# Patient Record
Sex: Female | Born: 1955 | Race: White | Hispanic: No | Marital: Married | Smoking: Former smoker
Health system: Southern US, Community
[De-identification: ages and names within clinical notes are randomized; demographics above are authoritative.]

## PROBLEM LIST (undated history)

## (undated) DIAGNOSIS — K759 Inflammatory liver disease, unspecified: Secondary | ICD-10-CM

## (undated) HISTORY — PX: WISDOM TOOTH EXTRACTION: SHX21

---

## 1984-08-09 HISTORY — PX: BREAST CYST EXCISION: SHX579

## 2005-09-03 ENCOUNTER — Ambulatory Visit: Payer: Self-pay | Admitting: Gastroenterology

## 2005-11-16 ENCOUNTER — Ambulatory Visit (HOSPITAL_COMMUNITY): Admission: RE | Admit: 2005-11-16 | Discharge: 2005-11-16 | Payer: Self-pay | Admitting: Gastroenterology

## 2006-12-31 IMAGING — US US BIOPSY
1 series · 12 of 12 positions shown · non-contrast
Comparison: none

CLINICAL DATA: Hepatitis C.  Request is made for random core liver biopsy.
 ULTRASOUND-GUIDED RANDOM CORE LIVER BIOPSY:

[Series 1: unknown · 0.28mm/px · 12 of 12 slices shown]
[im 1/12]
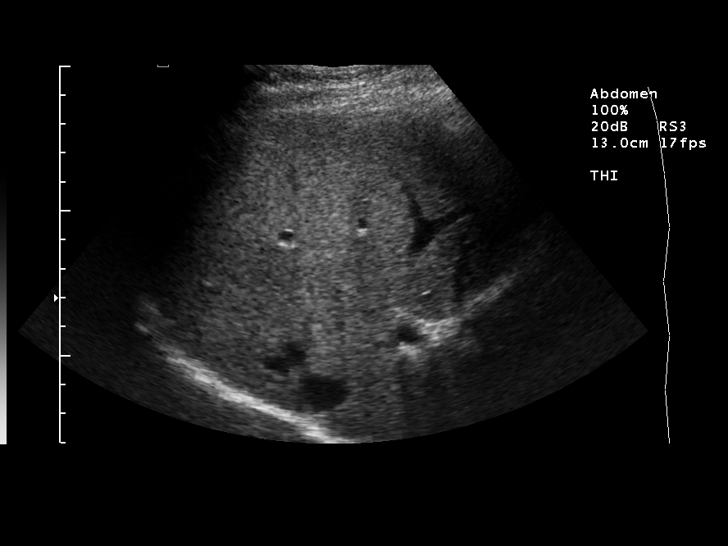
[im 2/12]
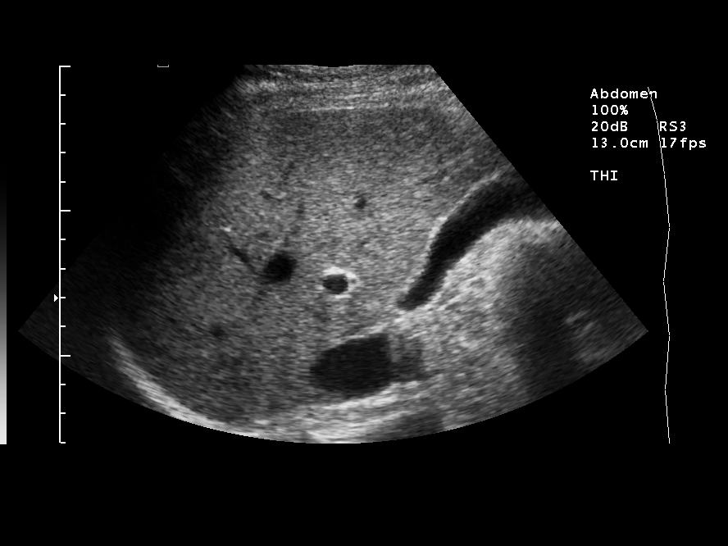
[im 3/12]
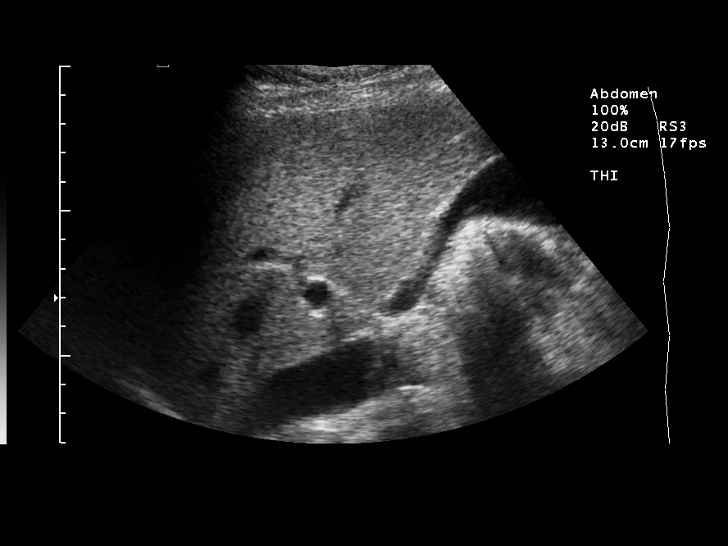
[im 4/12]
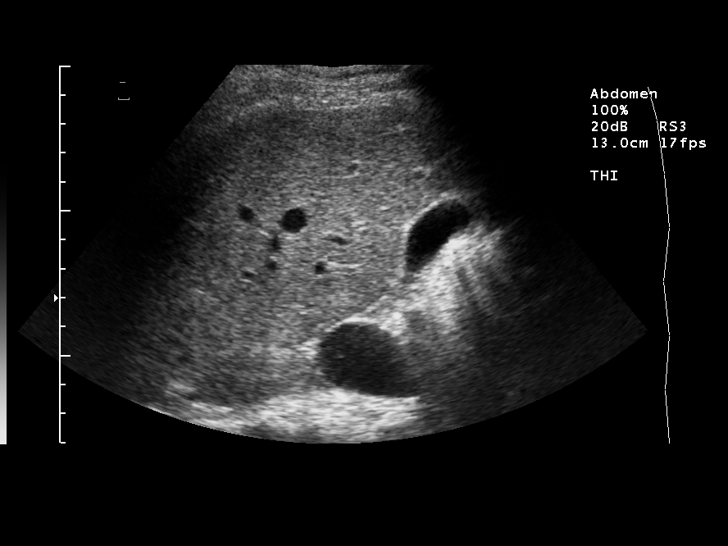
[im 5/12]
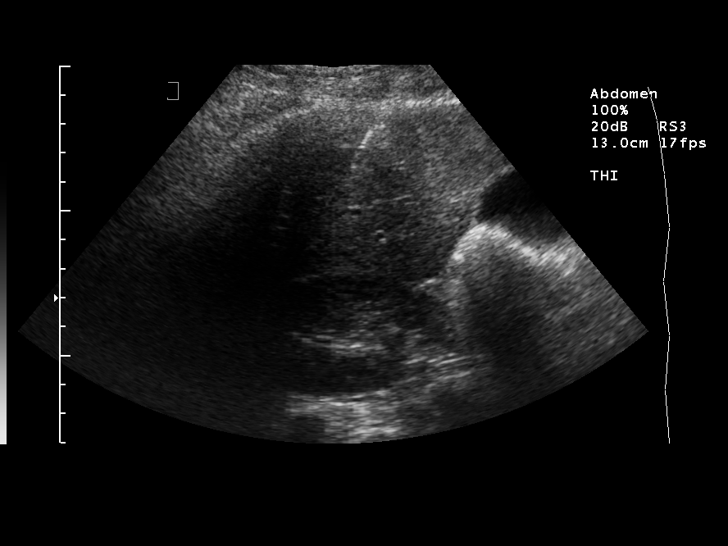
[im 6/12]
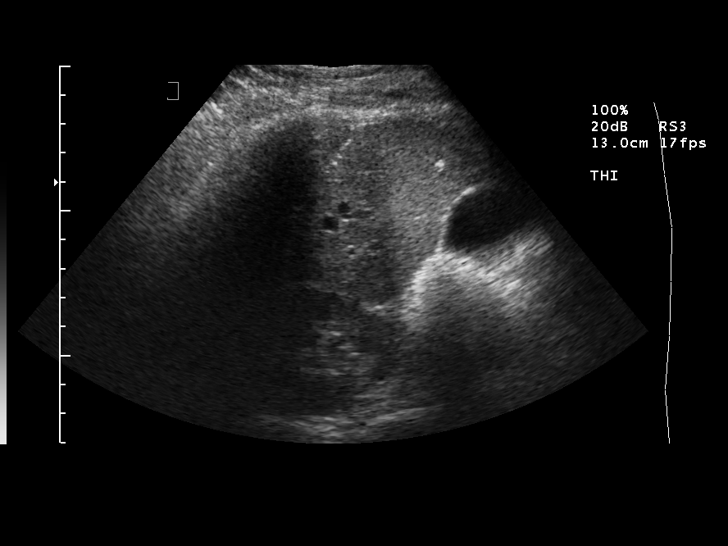
[im 7/12]
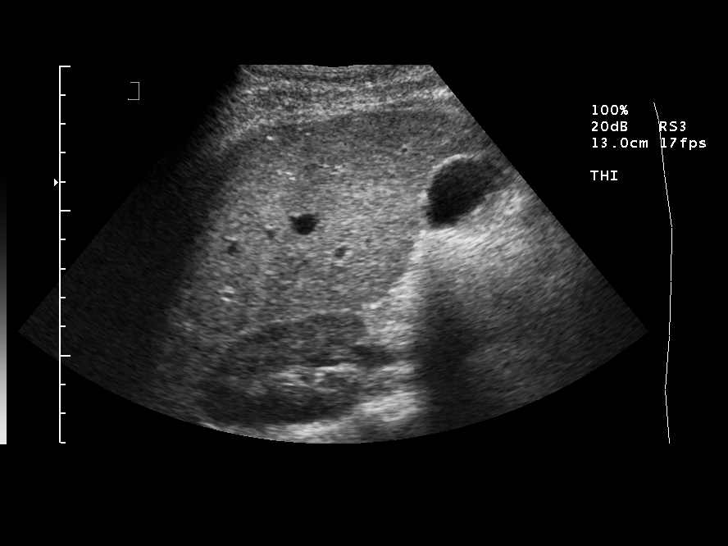
[im 8/12]
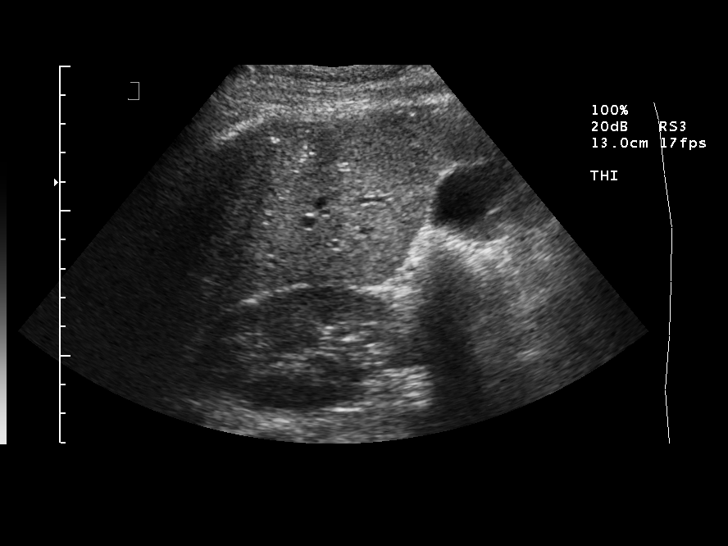
[im 9/12]
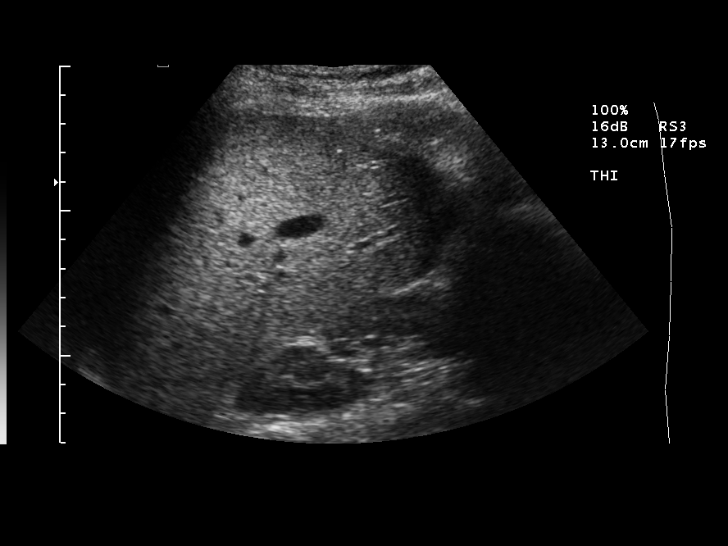
[im 10/12]
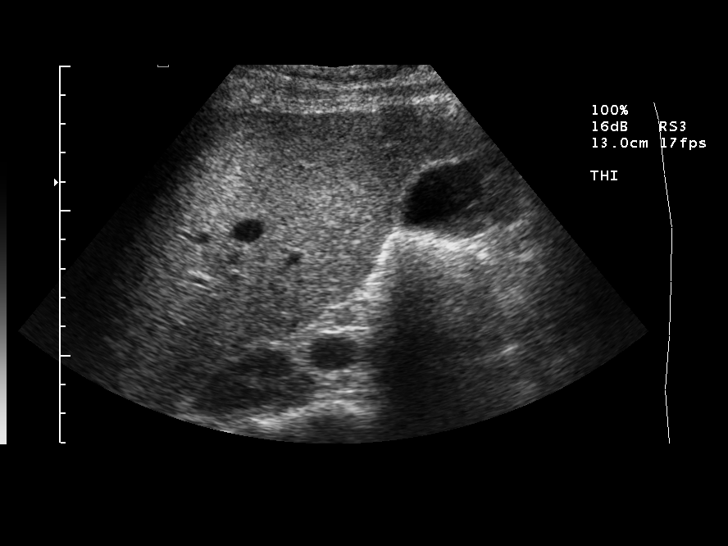
[im 11/12]
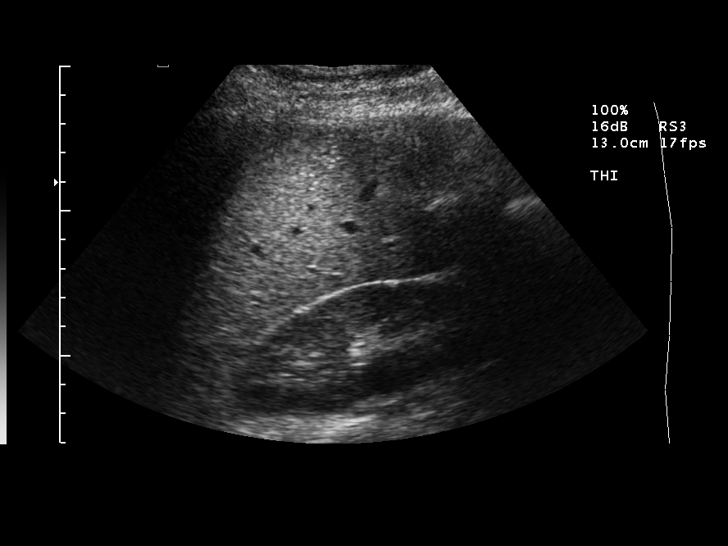
[im 12/12]
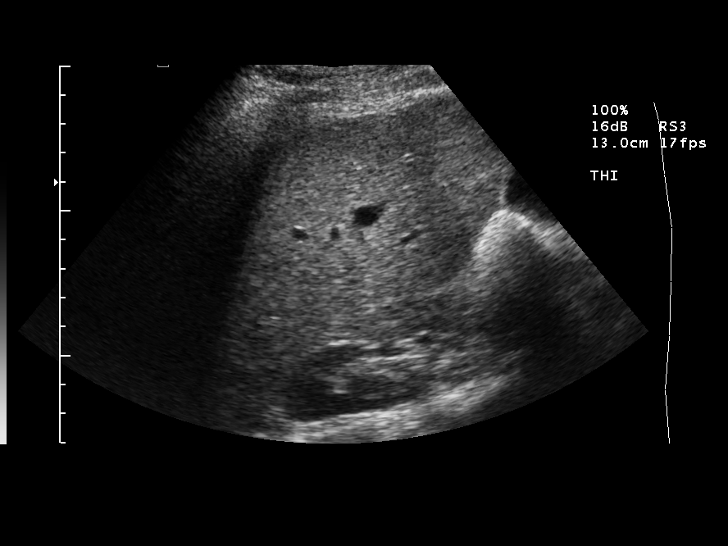

[12 of 12 positions shown; findings below may reference images not displayed]

FINDINGS: An ultrasound-guided random core liver biopsy was thoroughly discussed with the patient and questions were answered.  The benefits, risks, alternatives, and complications were also discussed.  The patient understands and wishes to proceed with the procedure.  Verbal as well as written consent was obtained.
 Under ultrasound guidance, an appropriate skin site was marked.  The patient was then prepped and draped in the normal sterile fashion.  1% lidocaine was used for local anesthesia.  Through a 17-gauge guiding trocar, three passes were then made into the right hepatic lobe with an 18-gauge biopsy gun.  Ultrasound imaging confirmed appropriate needle placement in the liver parenchyma.  Specimens were sent to pathology for further evaluation.  The patient tolerated the procedure well and there were no immediate complications.  
 Medications utilized:  Versed 2 mg IV, fentanyl 50 micrograms IV.  Cardiorespiratory monitoring was performed by the interventional radiology nurse during the procedure.  The patient?s vital signs remained stable throughout the procedure, and she will be observed in the [REDACTED] for an additional 3 hours post-biopsy and then discharged home afterwards if stable.
 Total Time of Sedation:  15 minutes.
 Procedure was performed under the supervision of Dr. Ebadat Tiger.
IMPRESSION: Successful ultrasound-guided random core liver biopsy of the right hepatic lobe as discussed above.

## 2015-04-12 DIAGNOSIS — H1010 Acute atopic conjunctivitis, unspecified eye: Secondary | ICD-10-CM | POA: Insufficient documentation

## 2015-04-12 DIAGNOSIS — J309 Allergic rhinitis, unspecified: Principal | ICD-10-CM

## 2015-04-12 DIAGNOSIS — G4720 Circadian rhythm sleep disorder, unspecified type: Secondary | ICD-10-CM

## 2015-05-12 ENCOUNTER — Ambulatory Visit (INDEPENDENT_AMBULATORY_CARE_PROVIDER_SITE_OTHER): Payer: BC Managed Care – PPO | Admitting: *Deleted

## 2015-05-12 DIAGNOSIS — J309 Allergic rhinitis, unspecified: Secondary | ICD-10-CM | POA: Diagnosis not present

## 2015-05-19 ENCOUNTER — Ambulatory Visit (INDEPENDENT_AMBULATORY_CARE_PROVIDER_SITE_OTHER): Payer: BC Managed Care – PPO | Admitting: *Deleted

## 2015-05-19 DIAGNOSIS — J309 Allergic rhinitis, unspecified: Secondary | ICD-10-CM | POA: Diagnosis not present

## 2015-05-26 ENCOUNTER — Ambulatory Visit (INDEPENDENT_AMBULATORY_CARE_PROVIDER_SITE_OTHER): Payer: BC Managed Care – PPO | Admitting: *Deleted

## 2015-05-26 DIAGNOSIS — J309 Allergic rhinitis, unspecified: Secondary | ICD-10-CM | POA: Diagnosis not present

## 2015-06-16 ENCOUNTER — Ambulatory Visit (INDEPENDENT_AMBULATORY_CARE_PROVIDER_SITE_OTHER): Payer: BC Managed Care – PPO

## 2015-06-16 DIAGNOSIS — J309 Allergic rhinitis, unspecified: Secondary | ICD-10-CM | POA: Diagnosis not present

## 2015-06-16 NOTE — Progress Notes (Signed)
This encounter was created in error - please disregard.

## 2015-07-07 ENCOUNTER — Ambulatory Visit (INDEPENDENT_AMBULATORY_CARE_PROVIDER_SITE_OTHER): Payer: BC Managed Care – PPO

## 2015-07-07 DIAGNOSIS — J309 Allergic rhinitis, unspecified: Secondary | ICD-10-CM | POA: Diagnosis not present

## 2015-07-16 DIAGNOSIS — J3089 Other allergic rhinitis: Secondary | ICD-10-CM | POA: Diagnosis not present

## 2015-07-21 ENCOUNTER — Ambulatory Visit (INDEPENDENT_AMBULATORY_CARE_PROVIDER_SITE_OTHER): Payer: BC Managed Care – PPO | Admitting: *Deleted

## 2015-07-21 DIAGNOSIS — J301 Allergic rhinitis due to pollen: Secondary | ICD-10-CM | POA: Diagnosis not present

## 2015-07-21 DIAGNOSIS — J309 Allergic rhinitis, unspecified: Secondary | ICD-10-CM

## 2015-07-28 ENCOUNTER — Ambulatory Visit (INDEPENDENT_AMBULATORY_CARE_PROVIDER_SITE_OTHER): Payer: BC Managed Care – PPO | Admitting: *Deleted

## 2015-07-28 DIAGNOSIS — J309 Allergic rhinitis, unspecified: Secondary | ICD-10-CM | POA: Diagnosis not present

## 2015-08-18 ENCOUNTER — Ambulatory Visit (INDEPENDENT_AMBULATORY_CARE_PROVIDER_SITE_OTHER): Payer: BC Managed Care – PPO

## 2015-08-18 DIAGNOSIS — J309 Allergic rhinitis, unspecified: Secondary | ICD-10-CM

## 2015-09-01 ENCOUNTER — Ambulatory Visit (INDEPENDENT_AMBULATORY_CARE_PROVIDER_SITE_OTHER): Payer: BC Managed Care – PPO | Admitting: *Deleted

## 2015-09-01 DIAGNOSIS — J309 Allergic rhinitis, unspecified: Secondary | ICD-10-CM | POA: Diagnosis not present

## 2015-09-15 ENCOUNTER — Ambulatory Visit (INDEPENDENT_AMBULATORY_CARE_PROVIDER_SITE_OTHER): Payer: BC Managed Care – PPO | Admitting: *Deleted

## 2015-09-15 DIAGNOSIS — J309 Allergic rhinitis, unspecified: Secondary | ICD-10-CM | POA: Diagnosis not present

## 2015-10-02 ENCOUNTER — Ambulatory Visit (INDEPENDENT_AMBULATORY_CARE_PROVIDER_SITE_OTHER): Payer: BC Managed Care – PPO | Admitting: *Deleted

## 2015-10-02 DIAGNOSIS — J309 Allergic rhinitis, unspecified: Secondary | ICD-10-CM | POA: Diagnosis not present

## 2015-10-06 ENCOUNTER — Ambulatory Visit (INDEPENDENT_AMBULATORY_CARE_PROVIDER_SITE_OTHER): Payer: BC Managed Care – PPO | Admitting: *Deleted

## 2015-10-06 DIAGNOSIS — J309 Allergic rhinitis, unspecified: Secondary | ICD-10-CM

## 2015-10-13 ENCOUNTER — Ambulatory Visit (INDEPENDENT_AMBULATORY_CARE_PROVIDER_SITE_OTHER): Payer: BC Managed Care – PPO

## 2015-10-13 DIAGNOSIS — J309 Allergic rhinitis, unspecified: Secondary | ICD-10-CM | POA: Diagnosis not present

## 2015-10-20 ENCOUNTER — Ambulatory Visit (INDEPENDENT_AMBULATORY_CARE_PROVIDER_SITE_OTHER): Payer: BC Managed Care – PPO | Admitting: *Deleted

## 2015-10-20 DIAGNOSIS — J309 Allergic rhinitis, unspecified: Secondary | ICD-10-CM | POA: Diagnosis not present

## 2015-10-30 ENCOUNTER — Ambulatory Visit (INDEPENDENT_AMBULATORY_CARE_PROVIDER_SITE_OTHER): Payer: BC Managed Care – PPO | Admitting: *Deleted

## 2015-10-30 DIAGNOSIS — J309 Allergic rhinitis, unspecified: Secondary | ICD-10-CM | POA: Diagnosis not present

## 2015-11-13 ENCOUNTER — Ambulatory Visit (INDEPENDENT_AMBULATORY_CARE_PROVIDER_SITE_OTHER): Payer: BC Managed Care – PPO | Admitting: *Deleted

## 2015-11-13 DIAGNOSIS — J309 Allergic rhinitis, unspecified: Secondary | ICD-10-CM | POA: Diagnosis not present

## 2015-11-24 ENCOUNTER — Ambulatory Visit (INDEPENDENT_AMBULATORY_CARE_PROVIDER_SITE_OTHER): Payer: BC Managed Care – PPO | Admitting: *Deleted

## 2015-11-24 DIAGNOSIS — J309 Allergic rhinitis, unspecified: Secondary | ICD-10-CM

## 2015-11-25 DIAGNOSIS — J301 Allergic rhinitis due to pollen: Secondary | ICD-10-CM | POA: Diagnosis not present

## 2015-11-26 DIAGNOSIS — J3089 Other allergic rhinitis: Secondary | ICD-10-CM | POA: Diagnosis not present

## 2015-12-04 ENCOUNTER — Ambulatory Visit (INDEPENDENT_AMBULATORY_CARE_PROVIDER_SITE_OTHER): Payer: BC Managed Care – PPO | Admitting: *Deleted

## 2015-12-04 DIAGNOSIS — J309 Allergic rhinitis, unspecified: Secondary | ICD-10-CM | POA: Diagnosis not present

## 2015-12-08 ENCOUNTER — Ambulatory Visit (INDEPENDENT_AMBULATORY_CARE_PROVIDER_SITE_OTHER): Payer: BC Managed Care – PPO

## 2015-12-08 DIAGNOSIS — J309 Allergic rhinitis, unspecified: Secondary | ICD-10-CM | POA: Diagnosis not present

## 2015-12-15 ENCOUNTER — Ambulatory Visit (INDEPENDENT_AMBULATORY_CARE_PROVIDER_SITE_OTHER): Payer: BC Managed Care – PPO

## 2015-12-15 DIAGNOSIS — J309 Allergic rhinitis, unspecified: Secondary | ICD-10-CM | POA: Diagnosis not present

## 2015-12-29 ENCOUNTER — Ambulatory Visit (INDEPENDENT_AMBULATORY_CARE_PROVIDER_SITE_OTHER): Payer: BC Managed Care – PPO

## 2015-12-29 DIAGNOSIS — J309 Allergic rhinitis, unspecified: Secondary | ICD-10-CM | POA: Diagnosis not present

## 2016-01-08 ENCOUNTER — Ambulatory Visit (INDEPENDENT_AMBULATORY_CARE_PROVIDER_SITE_OTHER): Payer: BC Managed Care – PPO

## 2016-01-08 DIAGNOSIS — J309 Allergic rhinitis, unspecified: Secondary | ICD-10-CM | POA: Diagnosis not present

## 2016-01-12 ENCOUNTER — Ambulatory Visit (INDEPENDENT_AMBULATORY_CARE_PROVIDER_SITE_OTHER): Payer: BC Managed Care – PPO | Admitting: *Deleted

## 2016-01-12 DIAGNOSIS — J309 Allergic rhinitis, unspecified: Secondary | ICD-10-CM | POA: Diagnosis not present

## 2016-01-19 ENCOUNTER — Ambulatory Visit (INDEPENDENT_AMBULATORY_CARE_PROVIDER_SITE_OTHER): Payer: BC Managed Care – PPO | Admitting: *Deleted

## 2016-01-19 DIAGNOSIS — J309 Allergic rhinitis, unspecified: Secondary | ICD-10-CM | POA: Diagnosis not present

## 2016-01-26 ENCOUNTER — Ambulatory Visit (INDEPENDENT_AMBULATORY_CARE_PROVIDER_SITE_OTHER): Payer: BC Managed Care – PPO

## 2016-01-26 DIAGNOSIS — J309 Allergic rhinitis, unspecified: Secondary | ICD-10-CM

## 2016-02-02 ENCOUNTER — Ambulatory Visit (INDEPENDENT_AMBULATORY_CARE_PROVIDER_SITE_OTHER): Payer: BC Managed Care – PPO | Admitting: *Deleted

## 2016-02-02 DIAGNOSIS — J309 Allergic rhinitis, unspecified: Secondary | ICD-10-CM | POA: Diagnosis not present

## 2016-02-16 ENCOUNTER — Ambulatory Visit (INDEPENDENT_AMBULATORY_CARE_PROVIDER_SITE_OTHER): Payer: BC Managed Care – PPO

## 2016-02-16 DIAGNOSIS — J309 Allergic rhinitis, unspecified: Secondary | ICD-10-CM

## 2016-02-17 DIAGNOSIS — J301 Allergic rhinitis due to pollen: Secondary | ICD-10-CM | POA: Diagnosis not present

## 2016-02-18 DIAGNOSIS — J3089 Other allergic rhinitis: Secondary | ICD-10-CM | POA: Diagnosis not present

## 2016-02-23 ENCOUNTER — Ambulatory Visit (INDEPENDENT_AMBULATORY_CARE_PROVIDER_SITE_OTHER): Payer: BC Managed Care – PPO | Admitting: *Deleted

## 2016-02-23 DIAGNOSIS — J309 Allergic rhinitis, unspecified: Secondary | ICD-10-CM | POA: Diagnosis not present

## 2016-03-01 ENCOUNTER — Ambulatory Visit (INDEPENDENT_AMBULATORY_CARE_PROVIDER_SITE_OTHER): Payer: BC Managed Care – PPO

## 2016-03-01 DIAGNOSIS — J309 Allergic rhinitis, unspecified: Secondary | ICD-10-CM

## 2016-03-08 ENCOUNTER — Ambulatory Visit (INDEPENDENT_AMBULATORY_CARE_PROVIDER_SITE_OTHER): Payer: BC Managed Care – PPO | Admitting: *Deleted

## 2016-03-08 DIAGNOSIS — J309 Allergic rhinitis, unspecified: Secondary | ICD-10-CM | POA: Diagnosis not present

## 2016-03-22 ENCOUNTER — Ambulatory Visit (INDEPENDENT_AMBULATORY_CARE_PROVIDER_SITE_OTHER): Payer: BC Managed Care – PPO | Admitting: *Deleted

## 2016-03-22 DIAGNOSIS — J309 Allergic rhinitis, unspecified: Secondary | ICD-10-CM | POA: Diagnosis not present

## 2016-03-29 ENCOUNTER — Ambulatory Visit (INDEPENDENT_AMBULATORY_CARE_PROVIDER_SITE_OTHER): Payer: BC Managed Care – PPO | Admitting: *Deleted

## 2016-03-29 DIAGNOSIS — J309 Allergic rhinitis, unspecified: Secondary | ICD-10-CM

## 2016-04-05 ENCOUNTER — Ambulatory Visit (INDEPENDENT_AMBULATORY_CARE_PROVIDER_SITE_OTHER): Payer: BC Managed Care – PPO | Admitting: *Deleted

## 2016-04-05 DIAGNOSIS — J309 Allergic rhinitis, unspecified: Secondary | ICD-10-CM

## 2016-04-19 ENCOUNTER — Ambulatory Visit (INDEPENDENT_AMBULATORY_CARE_PROVIDER_SITE_OTHER): Payer: BC Managed Care – PPO | Admitting: *Deleted

## 2016-04-19 DIAGNOSIS — J309 Allergic rhinitis, unspecified: Secondary | ICD-10-CM

## 2016-04-26 ENCOUNTER — Ambulatory Visit (INDEPENDENT_AMBULATORY_CARE_PROVIDER_SITE_OTHER): Payer: BC Managed Care – PPO | Admitting: *Deleted

## 2016-04-26 DIAGNOSIS — J309 Allergic rhinitis, unspecified: Secondary | ICD-10-CM | POA: Diagnosis not present

## 2016-05-14 ENCOUNTER — Ambulatory Visit (INDEPENDENT_AMBULATORY_CARE_PROVIDER_SITE_OTHER): Payer: BC Managed Care – PPO | Admitting: *Deleted

## 2016-05-14 DIAGNOSIS — J309 Allergic rhinitis, unspecified: Secondary | ICD-10-CM | POA: Diagnosis not present

## 2016-05-20 ENCOUNTER — Ambulatory Visit: Payer: BC Managed Care – PPO | Admitting: Allergy and Immunology

## 2016-05-28 ENCOUNTER — Ambulatory Visit (INDEPENDENT_AMBULATORY_CARE_PROVIDER_SITE_OTHER): Payer: BC Managed Care – PPO | Admitting: *Deleted

## 2016-05-28 DIAGNOSIS — J309 Allergic rhinitis, unspecified: Secondary | ICD-10-CM

## 2016-06-14 ENCOUNTER — Ambulatory Visit (INDEPENDENT_AMBULATORY_CARE_PROVIDER_SITE_OTHER): Payer: BC Managed Care – PPO | Admitting: *Deleted

## 2016-06-14 DIAGNOSIS — J309 Allergic rhinitis, unspecified: Secondary | ICD-10-CM | POA: Diagnosis not present

## 2016-06-16 DIAGNOSIS — J3089 Other allergic rhinitis: Secondary | ICD-10-CM | POA: Diagnosis not present

## 2016-06-17 DIAGNOSIS — J301 Allergic rhinitis due to pollen: Secondary | ICD-10-CM | POA: Diagnosis not present

## 2016-06-24 ENCOUNTER — Ambulatory Visit (INDEPENDENT_AMBULATORY_CARE_PROVIDER_SITE_OTHER): Payer: BC Managed Care – PPO | Admitting: *Deleted

## 2016-06-24 DIAGNOSIS — J309 Allergic rhinitis, unspecified: Secondary | ICD-10-CM

## 2016-07-08 ENCOUNTER — Ambulatory Visit (INDEPENDENT_AMBULATORY_CARE_PROVIDER_SITE_OTHER): Payer: BC Managed Care – PPO | Admitting: *Deleted

## 2016-07-08 DIAGNOSIS — J309 Allergic rhinitis, unspecified: Secondary | ICD-10-CM

## 2016-07-19 ENCOUNTER — Ambulatory Visit (INDEPENDENT_AMBULATORY_CARE_PROVIDER_SITE_OTHER): Payer: BC Managed Care – PPO | Admitting: *Deleted

## 2016-07-19 DIAGNOSIS — J309 Allergic rhinitis, unspecified: Secondary | ICD-10-CM

## 2016-08-05 ENCOUNTER — Ambulatory Visit (INDEPENDENT_AMBULATORY_CARE_PROVIDER_SITE_OTHER): Payer: BC Managed Care – PPO | Admitting: *Deleted

## 2016-08-05 DIAGNOSIS — J309 Allergic rhinitis, unspecified: Secondary | ICD-10-CM | POA: Diagnosis not present

## 2016-08-16 ENCOUNTER — Ambulatory Visit (INDEPENDENT_AMBULATORY_CARE_PROVIDER_SITE_OTHER): Payer: BC Managed Care – PPO | Admitting: *Deleted

## 2016-08-16 DIAGNOSIS — J309 Allergic rhinitis, unspecified: Secondary | ICD-10-CM | POA: Diagnosis not present

## 2016-08-31 NOTE — Addendum Note (Signed)
Addended by: Berna BueWHITAKER, Armie Moren L on: 08/31/2016 02:01 PM   Modules accepted: Orders

## 2016-09-02 ENCOUNTER — Ambulatory Visit (INDEPENDENT_AMBULATORY_CARE_PROVIDER_SITE_OTHER): Payer: BC Managed Care – PPO | Admitting: *Deleted

## 2016-09-02 DIAGNOSIS — J309 Allergic rhinitis, unspecified: Secondary | ICD-10-CM | POA: Diagnosis not present

## 2016-09-13 ENCOUNTER — Ambulatory Visit (INDEPENDENT_AMBULATORY_CARE_PROVIDER_SITE_OTHER): Payer: BC Managed Care – PPO | Admitting: *Deleted

## 2016-09-13 DIAGNOSIS — J309 Allergic rhinitis, unspecified: Secondary | ICD-10-CM

## 2016-09-27 ENCOUNTER — Ambulatory Visit (INDEPENDENT_AMBULATORY_CARE_PROVIDER_SITE_OTHER): Payer: BC Managed Care – PPO | Admitting: *Deleted

## 2016-09-27 DIAGNOSIS — J309 Allergic rhinitis, unspecified: Secondary | ICD-10-CM | POA: Diagnosis not present

## 2016-10-18 ENCOUNTER — Ambulatory Visit (INDEPENDENT_AMBULATORY_CARE_PROVIDER_SITE_OTHER): Payer: BC Managed Care – PPO | Admitting: *Deleted

## 2016-10-18 DIAGNOSIS — J309 Allergic rhinitis, unspecified: Secondary | ICD-10-CM

## 2016-10-28 DIAGNOSIS — J301 Allergic rhinitis due to pollen: Secondary | ICD-10-CM | POA: Diagnosis not present

## 2016-10-29 DIAGNOSIS — J3089 Other allergic rhinitis: Secondary | ICD-10-CM | POA: Diagnosis not present

## 2016-11-08 ENCOUNTER — Ambulatory Visit (INDEPENDENT_AMBULATORY_CARE_PROVIDER_SITE_OTHER): Payer: BC Managed Care – PPO | Admitting: *Deleted

## 2016-11-08 DIAGNOSIS — J309 Allergic rhinitis, unspecified: Secondary | ICD-10-CM

## 2016-11-29 ENCOUNTER — Ambulatory Visit (INDEPENDENT_AMBULATORY_CARE_PROVIDER_SITE_OTHER): Payer: BC Managed Care – PPO | Admitting: *Deleted

## 2016-11-29 DIAGNOSIS — J309 Allergic rhinitis, unspecified: Secondary | ICD-10-CM | POA: Diagnosis not present

## 2016-12-16 ENCOUNTER — Ambulatory Visit (INDEPENDENT_AMBULATORY_CARE_PROVIDER_SITE_OTHER): Payer: BC Managed Care – PPO | Admitting: *Deleted

## 2016-12-16 DIAGNOSIS — J309 Allergic rhinitis, unspecified: Secondary | ICD-10-CM

## 2017-01-10 ENCOUNTER — Ambulatory Visit (INDEPENDENT_AMBULATORY_CARE_PROVIDER_SITE_OTHER): Payer: BC Managed Care – PPO | Admitting: *Deleted

## 2017-01-10 DIAGNOSIS — J309 Allergic rhinitis, unspecified: Secondary | ICD-10-CM | POA: Diagnosis not present

## 2017-01-31 ENCOUNTER — Ambulatory Visit (INDEPENDENT_AMBULATORY_CARE_PROVIDER_SITE_OTHER): Payer: BC Managed Care – PPO

## 2017-01-31 DIAGNOSIS — J309 Allergic rhinitis, unspecified: Secondary | ICD-10-CM | POA: Diagnosis not present

## 2017-02-17 ENCOUNTER — Ambulatory Visit (INDEPENDENT_AMBULATORY_CARE_PROVIDER_SITE_OTHER): Payer: BC Managed Care – PPO

## 2017-02-17 DIAGNOSIS — J309 Allergic rhinitis, unspecified: Secondary | ICD-10-CM | POA: Diagnosis not present

## 2017-03-07 ENCOUNTER — Ambulatory Visit (INDEPENDENT_AMBULATORY_CARE_PROVIDER_SITE_OTHER): Payer: BC Managed Care – PPO | Admitting: *Deleted

## 2017-03-07 DIAGNOSIS — J309 Allergic rhinitis, unspecified: Secondary | ICD-10-CM | POA: Diagnosis not present

## 2017-03-21 ENCOUNTER — Ambulatory Visit (INDEPENDENT_AMBULATORY_CARE_PROVIDER_SITE_OTHER): Payer: BC Managed Care – PPO | Admitting: *Deleted

## 2017-03-21 DIAGNOSIS — J309 Allergic rhinitis, unspecified: Secondary | ICD-10-CM | POA: Diagnosis not present

## 2017-04-08 ENCOUNTER — Ambulatory Visit (INDEPENDENT_AMBULATORY_CARE_PROVIDER_SITE_OTHER): Payer: BC Managed Care – PPO | Admitting: *Deleted

## 2017-04-08 DIAGNOSIS — J309 Allergic rhinitis, unspecified: Secondary | ICD-10-CM

## 2017-04-25 ENCOUNTER — Ambulatory Visit (INDEPENDENT_AMBULATORY_CARE_PROVIDER_SITE_OTHER): Payer: BC Managed Care – PPO | Admitting: *Deleted

## 2017-04-25 DIAGNOSIS — J309 Allergic rhinitis, unspecified: Secondary | ICD-10-CM

## 2017-04-28 ENCOUNTER — Encounter: Payer: Self-pay | Admitting: *Deleted

## 2017-04-28 NOTE — Progress Notes (Signed)
This encounter was created in error - please disregard.

## 2017-05-16 ENCOUNTER — Ambulatory Visit (INDEPENDENT_AMBULATORY_CARE_PROVIDER_SITE_OTHER): Payer: BC Managed Care – PPO | Admitting: *Deleted

## 2017-05-16 DIAGNOSIS — J309 Allergic rhinitis, unspecified: Secondary | ICD-10-CM

## 2017-05-25 NOTE — Progress Notes (Signed)
VIALS EXP 05-25-18 

## 2017-05-26 DIAGNOSIS — J3089 Other allergic rhinitis: Secondary | ICD-10-CM | POA: Diagnosis not present

## 2017-05-27 ENCOUNTER — Ambulatory Visit (INDEPENDENT_AMBULATORY_CARE_PROVIDER_SITE_OTHER): Payer: BC Managed Care – PPO | Admitting: *Deleted

## 2017-05-27 DIAGNOSIS — J309 Allergic rhinitis, unspecified: Secondary | ICD-10-CM | POA: Diagnosis not present

## 2017-06-06 ENCOUNTER — Ambulatory Visit (INDEPENDENT_AMBULATORY_CARE_PROVIDER_SITE_OTHER): Payer: BC Managed Care – PPO | Admitting: *Deleted

## 2017-06-06 DIAGNOSIS — J309 Allergic rhinitis, unspecified: Secondary | ICD-10-CM

## 2017-07-04 ENCOUNTER — Ambulatory Visit (INDEPENDENT_AMBULATORY_CARE_PROVIDER_SITE_OTHER): Payer: BC Managed Care – PPO | Admitting: *Deleted

## 2017-07-04 DIAGNOSIS — J309 Allergic rhinitis, unspecified: Secondary | ICD-10-CM

## 2017-08-08 ENCOUNTER — Ambulatory Visit (INDEPENDENT_AMBULATORY_CARE_PROVIDER_SITE_OTHER): Payer: BC Managed Care – PPO | Admitting: *Deleted

## 2017-08-08 DIAGNOSIS — J309 Allergic rhinitis, unspecified: Secondary | ICD-10-CM

## 2022-01-18 ENCOUNTER — Ambulatory Visit: Payer: Self-pay | Admitting: Orthopedic Surgery

## 2022-01-21 NOTE — Patient Instructions (Signed)
DUE TO COVID-19 ONLY TWO VISITORS  (aged 64 and older)  ARE ALLOWED TO COME WITH YOU AND STAY IN THE WAITING ROOM ONLY DURING PRE OP AND PROCEDURE.   **NO VISITORS ARE ALLOWED IN THE SHORT STAY AREA OR RECOVERY ROOM!!**  IF YOU WILL BE ADMITTED INTO THE HOSPITAL YOU ARE ALLOWED ONLY FOUR SUPPORT PEOPLE DURING VISITATION HOURS ONLY (7 AM -8PM)   The support person(s) must pass our screening, gel in and out, and wear a mask at all times, including in the patient's room. Patients must also wear a mask when staff or their support person are in the room. Visitors GUEST BADGE MUST BE WORN VISIBLY  One adult visitor may remain with you overnight and MUST be in the room by 8 P.M.     Your procedure is scheduled on: 01/27/22   Report to [facility] Main Entrance    Report to admitting at   5:45 AM   Call this number if you have problems the morning of surgery [phone]   Do not eat food :After Midnight.   After Midnight you may have the following liquids until _5:30_ AM/  DAY OF SURGERY  Water Black Coffee (sugar ok, NO MILK/CREAM OR CREAMERS)  Tea (sugar ok, NO MILK/CREAM OR CREAMERS) regular and decaf                             Plain Jell-O (NO RED)                                           Fruit ices (not with fruit pulp, NO RED)                                     Popsicles (NO RED)                                                                  Juice: apple, WHITE grape, WHITE cranberry Sports drinks like Gatorade (NO RED) Clear broth(vegetable,chicken,beef)                   The day of surgery:  Drink ONE (1) Pre-Surgery Clear Ensure at 5:15 AM the morning of surgery. Drink in one sitting. Do not sip.  This drink was given to you during your hospital  pre-op appointment visit. Nothing else to drink after completing the  Pre-Surgery Clear Ensure at 5:30 AM          If you have questions, please contact your surgeon's office.   FOLLOW BOWEL PREP AND ANY ADDITIONAL  PRE OP INSTRUCTIONS YOU RECEIVED FROM YOUR SURGEON'S OFFICE!!!     Oral Hygiene is also important to reduce your risk of infection.                                    Remember - BRUSH YOUR TEETH THE MORNING OF SURGERY WITH YOUR REGULAR TOOTHPASTE   Do NOT smoke after Midnight  Take these medicines the morning of surgery with A SIP OF WATER: Zyrtec, use eye drops as usual   Bring CPAP mask and tubing day of surgery.                              You may not have any metal on your body including hair pins, jewelry, and body piercing             Do not wear make-up, lotions, powders, perfumes/cologne, or deodorant  Do not wear nail polish including gel and S&S, artificial/acrylic nails, or any other type of covering on natural nails including finger and toenails. If you have artificial nails, gel coating, etc. that needs to be removed by a nail salon please have this removed prior to surgery or surgery may need to be canceled/ delayed if the surgeon/ anesthesia feels like they are unable to be safely monitored.   Do not shave  48 hours prior to surgery.     Do not bring valuables to the hospital. [facility] IS NOT             RESPONSIBLE   FOR VALUABLES.   Contacts, dentures or bridgework may not be worn into surgery.   Bring small overnight bag day of surgery.   DO NOT BRING YOUR HOME MEDICATIONS TO THE HOSPITAL. PHARMACY WILL DISPENSE MEDICATIONS LISTED ON YOUR MEDICATION LIST TO YOU DURING YOUR ADMISSION IN THE HOSPITAL!       Special Instructions: Bring a copy of your healthcare power of attorney and living will documents the day of surgery if you haven't scanned them before.              Please read over the following fact sheets you were given: IF YOU HAVE QUESTIONS ABOUT YOUR PRE-OP INSTRUCTIONS PLEASE CALL [phone]     [facility] - Preparing for Surgery Before surgery, you can play an important role.  Because skin is not sterile, your skin needs to be as free of  germs as possible.  You can reduce the number of germs on your skin by washing with CHG (chlorahexidine gluconate) soap before surgery.  CHG is an antiseptic cleaner which kills germs and bonds with the skin to continue killing germs even after washing. Please DO NOT use if you have an allergy to CHG or antibacterial soaps.  If your skin becomes reddened/irritated stop using the CHG and inform your nurse when you arrive at Short Stay. Do not shave (including legs and underarms) for at least 48 hours prior to the first CHG shower.   Please follow these instructions carefully:  1.  Shower with CHG Soap the night before surgery and the  morning of Surgery.  2.  If you choose to wash your hair, wash your hair first as usual with your  normal  shampoo.  3.  After you shampoo, rinse your hair and body thoroughly to remove the  shampoo.                            4.  Use CHG as you would any other liquid soap.  You can apply chg directly  to the skin and wash                       Gently with a scrungie or clean washcloth.  5.  Apply the CHG Soap to  your body ONLY FROM THE NECK DOWN.   Do not use on face/ open                           Wound or open sores. Avoid contact with eyes, ears mouth and genitals (private parts).                       Wash face,  Genitals (private parts) with your normal soap.             6.  Wash thoroughly, paying special attention to the area where your surgery  will be performed.  7.  Thoroughly rinse your body with warm water from the neck down.  8.  DO NOT shower/wash with your normal soap after using and rinsing off  the CHG Soap.                9.  Pat yourself dry with a clean towel.            10.  Wear clean pajamas.            11.  Place clean sheets on your bed the night of your first shower and do not  sleep with pets. Day of Surgery : Do not apply any lotions/deodorants the morning of surgery.  Please wear clean clothes to the hospital/surgery center.  FAILURE TO  FOLLOW THESE INSTRUCTIONS MAY RESULT IN THE CANCELLATION OF YOUR SURGERY    ________________________________________________________________________   Incentive Spirometer  An incentive spirometer is a tool that can help keep your lungs clear and active. This tool measures how well you are filling your lungs with each breath. Taking long deep breaths may help reverse or decrease the chance of developing breathing (pulmonary) problems (especially infection) following: A long period of time when you are unable to move or be active. BEFORE THE PROCEDURE  If the spirometer includes an indicator to show your best effort, your nurse or respiratory therapist will set it to a desired goal. If possible, sit up straight or lean slightly forward. Try not to slouch. Hold the incentive spirometer in an upright position. INSTRUCTIONS FOR USE  Sit on the edge of your bed if possible, or sit up as far as you can in bed or on a chair. Hold the incentive spirometer in an upright position. Breathe out normally. Place the mouthpiece in your mouth and seal your lips tightly around it. Breathe in slowly and as deeply as possible, raising the piston or the ball toward the top of the column. Hold your breath for 3-5 seconds or for as long as possible. Allow the piston or ball to fall to the bottom of the column. Remove the mouthpiece from your mouth and breathe out normally. Rest for a few seconds and repeat Steps 1 through 7 at least 10 times every 1-2 hours when you are awake. Take your time and take a few normal breaths between deep breaths. The spirometer may include an indicator to show your best effort. Use the indicator as a goal to work toward during each repetition. After each set of 10 deep breaths, practice coughing to be sure your lungs are clear. If you have an incision (the cut made at the time of surgery), support your incision when coughing by placing a pillow or rolled up towels firmly against  it. Once you are able to get out of bed, walk around indoors and cough well.  You may stop using the incentive spirometer when instructed by your caregiver.  RISKS AND COMPLICATIONS Take your time so you do not get dizzy or light-headed. If you are in pain, you may need to take or ask for pain medication before doing incentive spirometry. It is harder to take a deep breath if you are having pain. AFTER USE Rest and breathe slowly and easily. It can be helpful to keep track of a log of your progress. Your caregiver can provide you with a simple table to help with this. If you are using the spirometer at home, follow these instructions: SEEK MEDICAL CARE IF:  You are having difficultly using the spirometer. You have trouble using the spirometer as often as instructed. Your pain medication is not giving enough relief while using the spirometer. You develop fever of 100.5 F (38.1 C) or higher. SEEK IMMEDIATE MEDICAL CARE IF:  You cough up bloody sputum that had not been present before. You develop fever of 102 F (38.9 C) or greater. You develop worsening pain at or near the incision site. MAKE SURE YOU:  Understand these instructions. Will watch your condition. Will get help right away if you are not doing well or get worse. Document Released: 12/06/2006 Document Revised: 10/18/2011 Document Reviewed: 02/06/2007 [facility] Patient Information 2014 [place], [place].   ________________________________________________________________________

## 2022-01-22 ENCOUNTER — Encounter (HOSPITAL_COMMUNITY)
Admission: RE | Admit: 2022-01-22 | Discharge: 2022-01-22 | Disposition: A | Discharge status: Home / Self Care | Payer: BC Managed Care – PPO | Source: Ambulatory Visit | Attending: Specialist | Admitting: Specialist

## 2022-01-22 ENCOUNTER — Other Ambulatory Visit: Payer: Self-pay

## 2022-01-22 ENCOUNTER — Encounter (HOSPITAL_COMMUNITY): Payer: Self-pay | Admitting: *Deleted

## 2022-01-22 DIAGNOSIS — Z01812 Encounter for preprocedural laboratory examination: Secondary | ICD-10-CM | POA: Diagnosis present

## 2022-01-22 DIAGNOSIS — Z01818 Encounter for other preprocedural examination: Secondary | ICD-10-CM

## 2022-01-22 HISTORY — DX: Inflammatory liver disease, unspecified: K75.9

## 2022-01-22 LAB — BASIC METABOLIC PANEL
Anion gap: 6 (ref 5–15)
BUN: 13 mg/dL (ref 8–23)
CO2: 27 mmol/L (ref 22–32)
Calcium: 9.5 mg/dL (ref 8.9–10.3)
Chloride: 107 mmol/L (ref 98–111)
Creatinine, Ser: 0.56 mg/dL (ref 0.44–1.00)
GFR, Estimated: >60 mL/min (ref >60)
Glucose, Bld: 104 mg/dL — ABNORMAL HIGH (ref 70–99)
Potassium: 4.3 mmol/L (ref 3.5–5.1)
Sodium: 140 mmol/L (ref 135–145)

## 2022-01-22 LAB — CBC
HCT: 40.6 % (ref 36.0–46.0)
Hemoglobin: 14 g/dL (ref 12.0–15.0)
MCH: 32.3 pg (ref 26.0–34.0)
MCHC: 34.5 g/dL (ref 30.0–36.0)
MCV: 93.5 fL (ref 80.0–100.0)
Platelets: 277 10*3/uL (ref 150–400)
RBC: 4.34 MIL/uL (ref 3.87–5.11)
RDW: 12.9 % (ref 11.5–15.5)
WBC: 5.3 10*3/uL (ref 4.0–10.5)
nRBC: 0 % (ref 0.0–0.2)

## 2022-01-22 LAB — SURGICAL PCR SCREEN
MRSA, PCR: NEGATIVE
Staphylococcus aureus: NEGATIVE

## 2022-01-22 NOTE — Progress Notes (Signed)
Anesthesia note:  Bowel prep reminder:NA  PCP - Dr. Jones Broom Cardiologist -none Other-   Chest x-ray - no EKG - 01/12/22-epic Stress Test - no ECHO - no Cardiac Cath - NA  Pacemaker/ICD device last checked:NA  Sleep Study - no CPAP -   Pt is pre diabetic-NA Fasting Blood Sugar -  Checks Blood Sugar _____  Blood Thinner:NA Blood Thinner Instructions: Aspirin Instructions: Last Dose:  Anesthesia review: No  Patient denies shortness of breath, fever, cough and chest pain at PAT appointment Pt has no SOB with any activities  Patient verbalized understanding of instructions that were given to them at the PAT appointment. Patient was also instructed that they will need to review over the PAT instructions again at home before surgery. Yes husband was with her at PAT visit

## 2022-01-25 ENCOUNTER — Ambulatory Visit: Payer: Self-pay | Admitting: Orthopedic Surgery

## 2022-01-25 NOTE — H&P (Signed)
Cindy Walter is an 66 y.o. female.   Chief Complaint: Right knee pain HPI: Patient is here for her H&P. Patient is scheduled for a right total knee replacement by Dr. Shelle Iron on 01/27/22 at [facility].  Patient with ongoing right knee pain and instability refractory to conservative treatment including injection therapy, bracing, quad strengthening, activity modifications, relative rest, medications. This is interfering with quality of life and activities of daily living at this point and she desires to proceed with surgery. She has been cleared by her PCP.  Dr. Shelle Iron and the patient mutually agreed to proceed with a total knee replacement. Risks and benefits of the procedure were discussed including stiffness, suboptimal range of motion, persistent pain, infection requiring removal of prosthesis and reinsertion, need for prophylactic antibiotics in the future, for example, dental procedures, possible need for manipulation, revision in the future and also anesthetic complications including DVT, PE, etc. We discussed the perioperative course, time in the hospital, postoperative recovery and the need for elevation to control swelling. We also discussed the predicted range of motion and the probability that squatting and kneeling would be unobtainable in the future. In addition, postoperative anticoagulation was discussed. We have obtained preoperative medical clearance as necessary. Provided illustrated handout and discussed it in detail. They will enroll in the total joint replacement educational forum at the hospital.  Past Medical History:  Diagnosis Date   Hepatitis    treated 2017    Past Surgical History:  Procedure Laterality Date   BREAST CYST EXCISION Right 1986   CESAREAN SECTION     x 3   WISDOM TOOTH EXTRACTION     age 44    No family history on file. Social History:  reports that she quit smoking about 14 years ago. Her smoking use included cigarettes. She has a 20.00  pack-year smoking history. She has never used smokeless tobacco. She reports current alcohol use. She reports that she does not currently use drugs.  Allergies: No Known Allergies  Current meds: alendronate 70 mg tablet cetirizine 10 mg tablet cholecalciferol (vitamin D3) 25 mcg (1,000 unit) capsule traMADoL 50 mg tablet  Review of Systems  Constitutional: Negative.   HENT: Negative.    Eyes: Negative.   Respiratory: Negative.    Cardiovascular: Negative.   Gastrointestinal: Negative.   Endocrine: Negative.   Genitourinary: Negative.   Musculoskeletal:  Positive for arthralgias, gait problem, joint swelling and myalgias.  Skin: Negative.   Psychiatric/Behavioral: Negative.      There were no vitals taken for this visit. Physical Exam Constitutional:      Appearance: Normal appearance.  HENT:     Head: Normocephalic and atraumatic.     Right Ear: External ear normal.     Left Ear: External ear normal.     Nose: Nose normal.     Mouth/Throat:     Pharynx: Oropharynx is clear.  Eyes:     Conjunctiva/sclera: Conjunctivae normal.  Cardiovascular:     Rate and Rhythm: Normal rate and regular rhythm.     Pulses: Normal pulses.     Heart sounds: Normal heart sounds.  Pulmonary:     Effort: Pulmonary effort is normal.     Breath sounds: Normal breath sounds.  Abdominal:     General: Bowel sounds are normal.  Musculoskeletal:     Cervical back: Normal range of motion.     Comments: Right knee she is tender medial joint line patellofemoral pain compression. Slight varus deformity. No DVT. Ipsilateral  hip and ankle exam is unremarkable. Mild effusion  Neurological:     Mental Status: She is alert.      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 Shelle Iron on June 21. We again discussed  the procedure itself as well as risks, complications and alternatives, including but not limited to DVT, PE, infx, bleeding, failure of procedure, need for secondary procedure including manipulation, nerve injury, ongoing pain/symptoms, anesthesia risk, even stroke or death. Also discussed typical post-op protocols, activity restrictions, need for PT, flexion/extension exercises, time out of work. Discussed need for DVT ppx post-op per protocol. Discussed dental ppx and infx prevention. Also discussed limitations post-operatively such as kneeling and squatting. All questions were answered. Patient desires to proceed with surgery as scheduled.  Will hold supplements, ASA and NSAIDs accordingly. Will remain NPO after midnight the night before surgery. Will present to [facility] for pre-op testing. Anticipate hospital stay to include at least 2 midnights given medical history and to ensure proper pain control. Plan ASA 81mg  BID for DVT ppx post-op. Plan oxycodone, Robaxin, Colace, Miralax. Plan home with HHPT post-op with family members at home for assistance. Will follow up 10-14 days post-op for suture removal and xrays.  Plan Right total knee replacement  , PA-C for Dr Dorothy Spark 01/25/2022, 12:35 PM

## 2022-01-25 NOTE — H&P (View-Only) (Signed)
Cindy Walter is an 66 y.o. female.   Chief Complaint: Right knee pain HPI: Patient is here for her H&P. Patient is scheduled for a right total knee replacement by Dr. Beane on 01/27/22 at [facility].  Patient with ongoing right knee pain and instability refractory to conservative treatment including injection therapy, bracing, quad strengthening, activity modifications, relative rest, medications. This is interfering with quality of life and activities of daily living at this point and she desires to proceed with surgery. She has been cleared by her PCP.  Dr. Beane and the patient mutually agreed to proceed with a total knee replacement. Risks and benefits of the procedure were discussed including stiffness, suboptimal range of motion, persistent pain, infection requiring removal of prosthesis and reinsertion, need for prophylactic antibiotics in the future, for example, dental procedures, possible need for manipulation, revision in the future and also anesthetic complications including DVT, PE, etc. We discussed the perioperative course, time in the hospital, postoperative recovery and the need for elevation to control swelling. We also discussed the predicted range of motion and the probability that squatting and kneeling would be unobtainable in the future. In addition, postoperative anticoagulation was discussed. We have obtained preoperative medical clearance as necessary. Provided illustrated handout and discussed it in detail. They will enroll in the total joint replacement educational forum at the hospital.  Past Medical History:  Diagnosis Date   Hepatitis    treated 2017    Past Surgical History:  Procedure Laterality Date   BREAST CYST EXCISION Right 1986   CESAREAN SECTION     x 3   WISDOM TOOTH EXTRACTION     age 52    No family history on file. Social History:  reports that she quit smoking about 14 years ago. Her smoking use included cigarettes. She has a 20.00  pack-year smoking history. She has never used smokeless tobacco. She reports current alcohol use. She reports that she does not currently use drugs.  Allergies: No Known Allergies  Current meds: alendronate 70 mg tablet cetirizine 10 mg tablet cholecalciferol (vitamin D3) 25 mcg (1,000 unit) capsule traMADoL 50 mg tablet  Review of Systems  Constitutional: Negative.   HENT: Negative.    Eyes: Negative.   Respiratory: Negative.    Cardiovascular: Negative.   Gastrointestinal: Negative.   Endocrine: Negative.   Genitourinary: Negative.   Musculoskeletal:  Positive for arthralgias, gait problem, joint swelling and myalgias.  Skin: Negative.   Psychiatric/Behavioral: Negative.      There were no vitals taken for this visit. Physical Exam Constitutional:      Appearance: Normal appearance.  HENT:     Head: Normocephalic and atraumatic.     Right Ear: External ear normal.     Left Ear: External ear normal.     Nose: Nose normal.     Mouth/Throat:     Pharynx: Oropharynx is clear.  Eyes:     Conjunctiva/sclera: Conjunctivae normal.  Cardiovascular:     Rate and Rhythm: Normal rate and regular rhythm.     Pulses: Normal pulses.     Heart sounds: Normal heart sounds.  Pulmonary:     Effort: Pulmonary effort is normal.     Breath sounds: Normal breath sounds.  Abdominal:     General: Bowel sounds are normal.  Musculoskeletal:     Cervical back: Normal range of motion.     Comments: Right knee she is tender medial joint line patellofemoral pain compression. Slight varus deformity. No DVT. Ipsilateral   hip and ankle exam is unremarkable. Mild effusion  Neurological:     Mental Status: She is alert.      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 Shelle Iron on June 21. We again discussed  the procedure itself as well as risks, complications and alternatives, including but not limited to DVT, PE, infx, bleeding, failure of procedure, need for secondary procedure including manipulation, nerve injury, ongoing pain/symptoms, anesthesia risk, even stroke or death. Also discussed typical post-op protocols, activity restrictions, need for PT, flexion/extension exercises, time out of work. Discussed need for DVT ppx post-op per protocol. Discussed dental ppx and infx prevention. Also discussed limitations post-operatively such as kneeling and squatting. All questions were answered. Patient desires to proceed with surgery as scheduled.  Will hold supplements, ASA and NSAIDs accordingly. Will remain NPO after midnight the night before surgery. Will present to [facility] for pre-op testing. Anticipate hospital stay to include at least 2 midnights given medical history and to ensure proper pain control. Plan ASA 81mg  BID for DVT ppx post-op. Plan oxycodone, Robaxin, Colace, Miralax. Plan home with HHPT post-op with family members at home for assistance. Will follow up 10-14 days post-op for suture removal and xrays.  Plan Right total knee replacement  , PA-C for Dr Dorothy Spark 01/25/2022, 12:35 PM

## 2022-01-27 ENCOUNTER — Ambulatory Visit (HOSPITAL_COMMUNITY): Payer: BC Managed Care – PPO

## 2022-01-27 ENCOUNTER — Other Ambulatory Visit: Payer: Self-pay

## 2022-01-27 ENCOUNTER — Encounter (HOSPITAL_COMMUNITY): Payer: Self-pay | Admitting: Specialist

## 2022-01-27 ENCOUNTER — Ambulatory Visit (HOSPITAL_COMMUNITY): Payer: BC Managed Care – PPO | Admitting: Certified Registered Nurse Anesthetist

## 2022-01-27 ENCOUNTER — Ambulatory Visit (HOSPITAL_COMMUNITY)
Admission: RE | Admit: 2022-01-27 | Discharge: 2022-01-28 | Disposition: A | Discharge status: Home / Self Care | Payer: BC Managed Care – PPO | Source: Ambulatory Visit | Attending: Specialist | Admitting: Specialist

## 2022-01-27 ENCOUNTER — Encounter (HOSPITAL_COMMUNITY)
Admission: RE | Disposition: A | Discharge status: Home / Self Care | Payer: Self-pay | Source: Ambulatory Visit | Attending: Specialist

## 2022-01-27 DIAGNOSIS — Z87891 Personal history of nicotine dependence: Secondary | ICD-10-CM | POA: Insufficient documentation

## 2022-01-27 DIAGNOSIS — M1711 Unilateral primary osteoarthritis, right knee: Secondary | ICD-10-CM | POA: Diagnosis present

## 2022-01-27 DIAGNOSIS — M21161 Varus deformity, not elsewhere classified, right knee: Secondary | ICD-10-CM | POA: Diagnosis not present

## 2022-01-27 DIAGNOSIS — Z01818 Encounter for other preprocedural examination: Secondary | ICD-10-CM

## 2022-01-27 SURGERY — ARTHROPLASTY, KNEE, TOTAL
Anesthesia: Spinal | Site: Knee | Laterality: Right

## 2022-01-27 MED ORDER — HYDROMORPHONE HCL 1 MG/ML IJ SOLN: 0.2500 mg | INTRAMUSCULAR | Status: DC | PRN

## 2022-01-27 MED ORDER — CHLORHEXIDINE GLUCONATE 0.12 % MT SOLN
15.0000 mL | Freq: Once | OROMUCOSAL | Status: AC
  Administered 2022-01-27: 15 mL via OROMUCOSAL

## 2022-01-27 MED ORDER — ROPIVACAINE HCL 5 MG/ML IJ SOLN
INTRAMUSCULAR | Status: DC | PRN
  Administered 2022-01-27: 30 mL via PERINEURAL

## 2022-01-27 MED ORDER — PROPOFOL 1000 MG/100ML IV EMUL
INTRAVENOUS | Status: AC
  Filled 2022-01-27: qty 100

## 2022-01-27 MED ORDER — FENTANYL CITRATE (PF) 100 MCG/2ML IJ SOLN
INTRAMUSCULAR | Status: DC | PRN
  Administered 2022-01-27: 25 ug via INTRAVENOUS
  Administered 2022-01-27: 50 ug via INTRAVENOUS
  Administered 2022-01-27: 25 ug via INTRAVENOUS

## 2022-01-27 MED ORDER — DOCUSATE SODIUM 100 MG PO CAPS
100.0000 mg | ORAL_CAPSULE | Freq: Two times a day (BID) | ORAL | Status: DC
  Administered 2022-01-27 – 2022-01-28 (×3): 100 mg via ORAL
  Filled 2022-01-27 (×3): qty 1

## 2022-01-27 MED ORDER — ONDANSETRON HCL 4 MG/2ML IJ SOLN
4.0000 mg | Freq: Four times a day (QID) | INTRAMUSCULAR | Status: DC | PRN
  Administered 2022-01-28: 4 mg via INTRAVENOUS
  Filled 2022-01-27: qty 2

## 2022-01-27 MED ORDER — LORATADINE 10 MG PO TABS
10.0000 mg | ORAL_TABLET | Freq: Every day | ORAL | Status: DC
  Administered 2022-01-28: 10 mg via ORAL
  Filled 2022-01-27: qty 1

## 2022-01-27 MED ORDER — METHOCARBAMOL 500 MG IVPB - SIMPLE MED: 500.0000 mg | Freq: Four times a day (QID) | INTRAVENOUS | Status: DC | PRN

## 2022-01-27 MED ORDER — KCL IN DEXTROSE-NACL 20-5-0.45 MEQ/L-%-% IV SOLN
INTRAVENOUS | Status: AC
  Filled 2022-01-27 (×2): qty 1000

## 2022-01-27 MED ORDER — POLYETHYLENE GLYCOL 3350 17 G PO PACK
17.0000 g | PACK | Freq: Every day | ORAL | Status: DC
  Administered 2022-01-27 – 2022-01-28 (×2): 17 g via ORAL
  Filled 2022-01-27 (×2): qty 1

## 2022-01-27 MED ORDER — ASPIRIN 81 MG PO CHEW
81.0000 mg | CHEWABLE_TABLET | Freq: Two times a day (BID) | ORAL | Status: DC
  Administered 2022-01-28: 81 mg via ORAL
  Filled 2022-01-27: qty 1

## 2022-01-27 MED ORDER — PHENYLEPHRINE 80 MCG/ML (10ML) SYRINGE FOR IV PUSH (FOR BLOOD PRESSURE SUPPORT)
PREFILLED_SYRINGE | INTRAVENOUS | Status: AC
  Filled 2022-01-27: qty 10

## 2022-01-27 MED ORDER — PHENYLEPHRINE 80 MCG/ML (10ML) SYRINGE FOR IV PUSH (FOR BLOOD PRESSURE SUPPORT)
PREFILLED_SYRINGE | INTRAVENOUS | Status: DC | PRN
  Administered 2022-01-27 (×3): 40 ug via INTRAVENOUS
  Administered 2022-01-27: 80 ug via INTRAVENOUS

## 2022-01-27 MED ORDER — TRANEXAMIC ACID-NACL 1000-0.7 MG/100ML-% IV SOLN
1000.0000 mg | INTRAVENOUS | Status: AC
  Administered 2022-01-27: 1000 mg via INTRAVENOUS
  Filled 2022-01-27: qty 100

## 2022-01-27 MED ORDER — ACETAMINOPHEN 325 MG PO TABS: 325.0000 mg | ORAL_TABLET | Freq: Once | ORAL | Status: DC | PRN

## 2022-01-27 MED ORDER — EPHEDRINE 5 MG/ML INJ
INTRAVENOUS | Status: AC
  Filled 2022-01-27: qty 5

## 2022-01-27 MED ORDER — LACTATED RINGERS IV SOLN: INTRAVENOUS | Status: DC

## 2022-01-27 MED ORDER — POLYETHYLENE GLYCOL 3350 17 G PO PACK: 17.0000 g | PACK | Freq: Every day | ORAL | 0 refills | Status: AC

## 2022-01-27 MED ORDER — SODIUM CHLORIDE 0.9 % IR SOLN
Status: DC | PRN
  Administered 2022-01-27: 1000 mL

## 2022-01-27 MED ORDER — BUPIVACAINE LIPOSOME 1.3 % IJ SUSP
INTRAMUSCULAR | Status: AC
  Filled 2022-01-27: qty 20

## 2022-01-27 MED ORDER — ACETAMINOPHEN 160 MG/5ML PO SOLN: 325.0000 mg | Freq: Once | ORAL | Status: DC | PRN

## 2022-01-27 MED ORDER — METOCLOPRAMIDE HCL 5 MG/ML IJ SOLN: 5.0000 mg | Freq: Three times a day (TID) | INTRAMUSCULAR | Status: DC | PRN

## 2022-01-27 MED ORDER — BISACODYL 5 MG PO TBEC: 5.0000 mg | DELAYED_RELEASE_TABLET | Freq: Every day | ORAL | Status: DC | PRN

## 2022-01-27 MED ORDER — ONDANSETRON HCL 4 MG/2ML IJ SOLN
INTRAMUSCULAR | Status: AC
  Filled 2022-01-27: qty 2

## 2022-01-27 MED ORDER — PROPOFOL 500 MG/50ML IV EMUL
INTRAVENOUS | Status: DC | PRN
  Administered 2022-01-27: 75 ug/kg/min via INTRAVENOUS

## 2022-01-27 MED ORDER — HYDROMORPHONE HCL 1 MG/ML IJ SOLN
0.5000 mg | INTRAMUSCULAR | Status: DC | PRN
  Administered 2022-01-27 – 2022-01-28 (×3): 1 mg via INTRAVENOUS
  Filled 2022-01-27 (×3): qty 1

## 2022-01-27 MED ORDER — STERILE WATER FOR IRRIGATION IR SOLN
Status: DC | PRN
  Administered 2022-01-27: 2000 mL

## 2022-01-27 MED ORDER — AMISULPRIDE (ANTIEMETIC) 5 MG/2ML IV SOLN: 10.0000 mg | Freq: Once | INTRAVENOUS | Status: DC | PRN

## 2022-01-27 MED ORDER — BUPIVACAINE-EPINEPHRINE (PF) 0.25% -1:200000 IJ SOLN
INTRAMUSCULAR | Status: AC
  Filled 2022-01-27: qty 30

## 2022-01-27 MED ORDER — ASCORBIC ACID 500 MG PO TABS
1000.0000 mg | ORAL_TABLET | Freq: Every day | ORAL | Status: DC
  Administered 2022-01-27 – 2022-01-28 (×2): 1000 mg via ORAL
  Filled 2022-01-27 (×2): qty 2

## 2022-01-27 MED ORDER — MEPERIDINE HCL 50 MG/ML IJ SOLN: 6.2500 mg | INTRAMUSCULAR | Status: DC | PRN

## 2022-01-27 MED ORDER — MENTHOL 3 MG MT LOZG: 1.0000 | LOZENGE | OROMUCOSAL | Status: DC | PRN

## 2022-01-27 MED ORDER — ACETAMINOPHEN 325 MG PO TABS: 325.0000 mg | ORAL_TABLET | Freq: Four times a day (QID) | ORAL | Status: DC | PRN

## 2022-01-27 MED ORDER — BUPIVACAINE-EPINEPHRINE 0.25% -1:200000 IJ SOLN
INTRAMUSCULAR | Status: DC | PRN
  Administered 2022-01-27 (×2): 30 mL

## 2022-01-27 MED ORDER — DIPHENHYDRAMINE HCL 12.5 MG/5ML PO ELIX: 12.5000 mg | ORAL_SOLUTION | ORAL | Status: DC | PRN

## 2022-01-27 MED ORDER — VITAMIN D 25 MCG (1000 UNIT) PO TABS
1000.0000 [IU] | ORAL_TABLET | Freq: Every day | ORAL | Status: DC
  Administered 2022-01-27 – 2022-01-28 (×2): 1000 [IU] via ORAL
  Filled 2022-01-27 (×2): qty 1

## 2022-01-27 MED ORDER — DOCUSATE SODIUM 100 MG PO CAPS
100.0000 mg | ORAL_CAPSULE | Freq: Two times a day (BID) | ORAL | 1 refills | Status: AC | PRN
Start: 2022-01-27 — End: ?

## 2022-01-27 MED ORDER — OXYCODONE HCL 5 MG PO TABS: 5.0000 mg | ORAL_TABLET | ORAL | 0 refills | Status: AC | PRN

## 2022-01-27 MED ORDER — EPHEDRINE SULFATE-NACL 50-0.9 MG/10ML-% IV SOSY
PREFILLED_SYRINGE | INTRAVENOUS | Status: DC | PRN
  Administered 2022-01-27: 5 mg via INTRAVENOUS

## 2022-01-27 MED ORDER — DEXAMETHASONE SODIUM PHOSPHATE 10 MG/ML IJ SOLN
INTRAMUSCULAR | Status: AC
  Filled 2022-01-27: qty 1

## 2022-01-27 MED ORDER — ORAL CARE MOUTH RINSE: 15.0000 mL | Freq: Once | OROMUCOSAL | Status: AC

## 2022-01-27 MED ORDER — METHOCARBAMOL 500 MG PO TABS: 500.0000 mg | ORAL_TABLET | Freq: Three times a day (TID) | ORAL | 1 refills | Status: AC | PRN

## 2022-01-27 MED ORDER — METHOCARBAMOL 500 MG PO TABS
500.0000 mg | ORAL_TABLET | Freq: Four times a day (QID) | ORAL | Status: DC | PRN
  Administered 2022-01-28: 500 mg via ORAL
  Filled 2022-01-27: qty 1

## 2022-01-27 MED ORDER — 0.9 % SODIUM CHLORIDE (POUR BTL) OPTIME
TOPICAL | Status: DC | PRN
  Administered 2022-01-27: 1000 mL

## 2022-01-27 MED ORDER — ASPIRIN 81 MG PO TBEC: 81.0000 mg | DELAYED_RELEASE_TABLET | Freq: Two times a day (BID) | ORAL | 1 refills | Status: AC

## 2022-01-27 MED ORDER — DEXAMETHASONE SODIUM PHOSPHATE 10 MG/ML IJ SOLN
INTRAMUSCULAR | Status: DC | PRN
  Administered 2022-01-27: 5 mg via INTRAVENOUS

## 2022-01-27 MED ORDER — ACETAMINOPHEN 500 MG PO TABS
1000.0000 mg | ORAL_TABLET | Freq: Four times a day (QID) | ORAL | Status: AC
  Administered 2022-01-27 – 2022-01-28 (×2): 1000 mg via ORAL
  Filled 2022-01-27 (×2): qty 2

## 2022-01-27 MED ORDER — ONDANSETRON HCL 4 MG/2ML IJ SOLN
INTRAMUSCULAR | Status: DC | PRN
  Administered 2022-01-27: 4 mg via INTRAVENOUS

## 2022-01-27 MED ORDER — MIDAZOLAM HCL 2 MG/2ML IJ SOLN
INTRAMUSCULAR | Status: AC
  Filled 2022-01-27: qty 2

## 2022-01-27 MED ORDER — ONDANSETRON HCL 4 MG PO TABS: 4.0000 mg | ORAL_TABLET | Freq: Four times a day (QID) | ORAL | Status: DC | PRN

## 2022-01-27 MED ORDER — SODIUM CHLORIDE (PF) 0.9 % IJ SOLN
INTRAMUSCULAR | Status: AC
Start: 2022-01-27 — End: ?
  Filled 2022-01-27: qty 50

## 2022-01-27 MED ORDER — ALUM & MAG HYDROXIDE-SIMETH 200-200-20 MG/5ML PO SUSP
30.0000 mL | ORAL | Status: DC | PRN
  Administered 2022-01-28: 30 mL via ORAL
  Filled 2022-01-27: qty 30

## 2022-01-27 MED ORDER — BUPIVACAINE IN DEXTROSE 0.75-8.25 % IT SOLN
INTRATHECAL | Status: DC | PRN
  Administered 2022-01-27: 1.6 mL via INTRATHECAL

## 2022-01-27 MED ORDER — ACETAMINOPHEN 10 MG/ML IV SOLN
1000.0000 mg | INTRAVENOUS | Status: AC
  Administered 2022-01-27: 1000 mg via INTRAVENOUS
  Filled 2022-01-27: qty 100

## 2022-01-27 MED ORDER — CEFAZOLIN SODIUM-DEXTROSE 2-4 GM/100ML-% IV SOLN
2.0000 g | Freq: Four times a day (QID) | INTRAVENOUS | Status: AC
  Administered 2022-01-27 (×2): 2 g via INTRAVENOUS
  Filled 2022-01-27 (×2): qty 100

## 2022-01-27 MED ORDER — OXYCODONE HCL 5 MG PO TABS
5.0000 mg | ORAL_TABLET | ORAL | Status: DC | PRN
  Administered 2022-01-27 – 2022-01-28 (×5): 10 mg via ORAL
  Filled 2022-01-27 (×6): qty 2

## 2022-01-27 MED ORDER — OLOPATADINE HCL 0.1 % OP SOLN
1.0000 [drp] | Freq: Every day | OPHTHALMIC | Status: DC | PRN
  Filled 2022-01-27: qty 5

## 2022-01-27 MED ORDER — RISAQUAD PO CAPS
1.0000 | ORAL_CAPSULE | Freq: Every day | ORAL | Status: DC
  Administered 2022-01-27 – 2022-01-28 (×2): 1 via ORAL
  Filled 2022-01-27 (×2): qty 1

## 2022-01-27 MED ORDER — FENTANYL CITRATE (PF) 100 MCG/2ML IJ SOLN
INTRAMUSCULAR | Status: AC
  Filled 2022-01-27: qty 2

## 2022-01-27 MED ORDER — ACETAMINOPHEN 10 MG/ML IV SOLN
1000.0000 mg | Freq: Once | INTRAVENOUS | Status: DC | PRN
Start: 2022-01-27 — End: 2022-01-27

## 2022-01-27 MED ORDER — MIDAZOLAM HCL 5 MG/5ML IJ SOLN
INTRAMUSCULAR | Status: DC | PRN
  Administered 2022-01-27: 2 mg via INTRAVENOUS

## 2022-01-27 MED ORDER — CEFAZOLIN SODIUM-DEXTROSE 2-4 GM/100ML-% IV SOLN
2.0000 g | INTRAVENOUS | Status: AC
  Administered 2022-01-27: 2 g via INTRAVENOUS
  Filled 2022-01-27: qty 100

## 2022-01-27 MED ORDER — PHENOL 1.4 % MT LIQD: 1.0000 | OROMUCOSAL | Status: DC | PRN

## 2022-01-27 MED ORDER — METOCLOPRAMIDE HCL 5 MG PO TABS: 5.0000 mg | ORAL_TABLET | Freq: Three times a day (TID) | ORAL | Status: DC | PRN

## 2022-01-27 SURGICAL SUPPLY — 72 items
ATTUNE PS FEM RT SZ 5 CEM KNEE (Femur) ×1 IMPLANT
ATTUNE PSRP INSR SZ5 6 KNEE (Insert) ×1 IMPLANT
BAG COUNTER SPONGE SURGICOUNT (BAG) ×1 IMPLANT
BAG DECANTER FOR FLEXI CONT (MISCELLANEOUS) ×2 IMPLANT
BAG ZIPLOCK 12X15 (MISCELLANEOUS) IMPLANT
BASE TIBIAL ROT PLAT SZ 5 KNEE (Knees) IMPLANT
BLADE SAW SGTL 11.0X1.19X90.0M (BLADE) ×2 IMPLANT
BLADE SAW SGTL 13.0X1.19X90.0M (BLADE) ×2 IMPLANT
BLADE SURG SZ10 CARB STEEL (BLADE) ×4 IMPLANT
BNDG COHESIVE 4X5 TAN ST LF (GAUZE/BANDAGES/DRESSINGS) ×1 IMPLANT
BNDG ELASTIC 4X5.8 VLCR STR LF (GAUZE/BANDAGES/DRESSINGS) ×2 IMPLANT
BNDG ELASTIC 6X5.8 VLCR STR LF (GAUZE/BANDAGES/DRESSINGS) ×2 IMPLANT
BOWL SMART MIX CTS (DISPOSABLE) ×2 IMPLANT
CEMENT HV SMART SET (Cement) ×4 IMPLANT
COVER SURGICAL LIGHT HANDLE (MISCELLANEOUS) ×2 IMPLANT
CUFF TOURN SGL QUICK 34 (TOURNIQUET CUFF) ×2
CUFF TRNQT CYL 34X4.125X (TOURNIQUET CUFF) ×1 IMPLANT
DRAPE INCISE IOBAN 66X45 STRL (DRAPES) IMPLANT
DRAPE ORTHO SPLIT 77X108 STRL (DRAPES) ×4
DRAPE SHEET LG 3/4 BI-LAMINATE (DRAPES) ×4 IMPLANT
DRAPE SURG ORHT 6 SPLT 77X108 (DRAPES) ×2 IMPLANT
DRAPE U-SHAPE 47X51 STRL (DRAPES) ×2 IMPLANT
DRSG AQUACEL AG ADV 3.5X10 (GAUZE/BANDAGES/DRESSINGS) ×2 IMPLANT
DRSG TEGADERM 4X4.75 (GAUZE/BANDAGES/DRESSINGS) IMPLANT
DURAPREP 26ML APPLICATOR (WOUND CARE) ×2 IMPLANT
ELECT BLADE TIP CTD 4 INCH (ELECTRODE) IMPLANT
ELECT REM PT RETURN 15FT ADLT (MISCELLANEOUS) ×2 IMPLANT
EVACUATOR 1/8 PVC DRAIN (DRAIN) IMPLANT
GAUZE SPONGE 2X2 8PLY STRL LF (GAUZE/BANDAGES/DRESSINGS) IMPLANT
GLOVE BIO SURGEON STRL SZ7 (GLOVE) ×2 IMPLANT
GLOVE BIOGEL PI IND STRL 7.0 (GLOVE) ×1 IMPLANT
GLOVE BIOGEL PI IND STRL 8 (GLOVE) ×1 IMPLANT
GLOVE BIOGEL PI INDICATOR 7.0 (GLOVE) ×1
GLOVE BIOGEL PI INDICATOR 8 (GLOVE) ×1
GLOVE SURG SS PI 8.0 STRL IVOR (GLOVE) ×4 IMPLANT
GOWN STRL REUS W/ TWL XL LVL3 (GOWN DISPOSABLE) ×2 IMPLANT
GOWN STRL REUS W/TWL XL LVL3 (GOWN DISPOSABLE) ×4
HANDPIECE INTERPULSE COAX TIP (DISPOSABLE) ×2
HEMOSTAT SPONGE AVITENE ULTRA (HEMOSTASIS) IMPLANT
HOLDER FOLEY CATH W/STRAP (MISCELLANEOUS) ×1 IMPLANT
IMMOBILIZER KNEE 20 (SOFTGOODS) ×2
IMMOBILIZER KNEE 20 THIGH 36 (SOFTGOODS) ×1 IMPLANT
KIT TURNOVER KIT A (KITS) ×1 IMPLANT
MANIFOLD NEPTUNE II (INSTRUMENTS) ×2 IMPLANT
NS IRRIG 1000ML POUR BTL (IV SOLUTION) ×1 IMPLANT
PACK TOTAL KNEE CUSTOM (KITS) ×2 IMPLANT
PATELLA MEDIAL ATTUN 35MM KNEE (Knees) ×1 IMPLANT
PROTECTOR NERVE ULNAR (MISCELLANEOUS) ×2 IMPLANT
SAW OSC TIP CART 19.5X105X1.3 (SAW) ×2 IMPLANT
SEALER BIPOLAR AQUA 6.0 (INSTRUMENTS) IMPLANT
SET HNDPC FAN SPRY TIP SCT (DISPOSABLE) ×1 IMPLANT
SOLUTION PRONTOSAN WOUND 350ML (IRRIGATION / IRRIGATOR) ×2 IMPLANT
SPIKE FLUID TRANSFER (MISCELLANEOUS) ×2 IMPLANT
SPONGE GAUZE 2X2 STER 10/PKG (GAUZE/BANDAGES/DRESSINGS)
STAPLER VISISTAT (STAPLE) IMPLANT
STRIP CLOSURE SKIN 1/2X4 (GAUZE/BANDAGES/DRESSINGS) ×2 IMPLANT
SUT BONE WAX W31G (SUTURE) ×2 IMPLANT
SUT MNCRL AB 4-0 PS2 18 (SUTURE) ×1 IMPLANT
SUT STRATAFIX 0 PDS 27 VIOLET (SUTURE) ×2
SUT VIC AB 1 CT1 27 (SUTURE) ×4
SUT VIC AB 1 CT1 27XBRD ANTBC (SUTURE) ×2 IMPLANT
SUT VIC AB 1 CTX 36 (SUTURE) ×6
SUT VIC AB 1 CTX36XBRD ANBCTR (SUTURE) ×3 IMPLANT
SUT VIC AB 2-0 CT1 27 (SUTURE) ×6
SUT VIC AB 2-0 CT1 TAPERPNT 27 (SUTURE) ×3 IMPLANT
SUTURE STRATFX 0 PDS 27 VIOLET (SUTURE) ×1 IMPLANT
SYR 3ML LL SCALE MARK (SYRINGE) IMPLANT
TIBIAL BASE ROT PLAT SZ 5 KNEE (Knees) ×2 IMPLANT
TRAY FOLEY MTR SLVR 16FR STAT (SET/KITS/TRAYS/PACK) ×2 IMPLANT
WATER STERILE IRR 1000ML POUR (IV SOLUTION) ×2 IMPLANT
WIPE CHG 2% PREP (PERSONAL CARE ITEMS) ×2 IMPLANT
WRAP KNEE MAXI GEL POST OP (GAUZE/BANDAGES/DRESSINGS) ×2 IMPLANT

## 2022-01-27 NOTE — Anesthesia Preprocedure Evaluation (Addendum)
Anesthesia Evaluation  Patient identified by MRN, date of birth, ID band Patient awake    Reviewed: Allergy & Precautions, NPO status , Patient's Chart, lab work & pertinent test results  Airway Mallampati: II  TM Distance: >3 FB Neck ROM: Full    Dental  (+) Teeth Intact, Dental Advisory Given   Pulmonary former smoker,    breath sounds clear to auscultation       Cardiovascular negative cardio ROS   Rhythm:Regular Rate:Normal     Neuro/Psych negative neurological ROS  negative psych ROS   GI/Hepatic negative GI ROS, (+) Hepatitis -  Endo/Other  negative endocrine ROS  Renal/GU negative Renal ROS     Musculoskeletal negative musculoskeletal ROS (+)   Abdominal Normal abdominal exam  (+)   Peds  Hematology negative hematology ROS (+)   Anesthesia Other Findings   Reproductive/Obstetrics                            Anesthesia Physical Anesthesia Plan  ASA: 2  Anesthesia Plan: Spinal   Post-op Pain Management: Regional block*   Induction: Intravenous  PONV Risk Score and Plan: 3 and Propofol infusion, Ondansetron and Midazolam  Airway Management Planned: Natural Airway and Simple Face Mask  Additional Equipment: None  Intra-op Plan:   Post-operative Plan:   Informed Consent: I have reviewed the patients History and Physical, chart, labs and discussed the procedure including the risks, benefits and alternatives for the proposed anesthesia with the patient or authorized representative who has indicated his/her understanding and acceptance.       Plan Discussed with: CRNA  Anesthesia Plan Comments: (Lab Results      Component                Value               Date                      WBC                      5.3                 01/22/2022                HGB                      14.0                01/22/2022                HCT                      40.6                 01/22/2022                MCV                      93.5                01/22/2022                PLT                      277  01/22/2022           )       Anesthesia Quick Evaluation

## 2022-01-27 NOTE — Anesthesia Procedure Notes (Signed)
Spinal  Start time: 01/27/2022 8:33 AM End time: 01/27/2022 8:36 AM Reason for block: surgical anesthesia Staffing Performed: anesthesiologist  Anesthesiologist: Shelton Silvas, MD Performed by: Shelton Silvas, MD Authorized by: Shelton Silvas, MD   Preanesthetic Checklist Completed: patient identified, IV checked, site marked, risks and benefits discussed, surgical consent, monitors and equipment checked, pre-op evaluation and timeout performed Spinal Block Patient position: sitting Prep: DuraPrep and site prepped and draped Location: L3-4 Injection technique: single-shot Needle Needle type: Pencan  Needle gauge: 24 G Needle length: 10 cm Needle insertion depth: 10 cm Additional Notes Patient tolerated well. No immediate complications.  Functioning IV was confirmed and monitors were applied. Sterile prep and drape, including hand hygiene and sterile gloves were used. The patient was positioned and the back was prepped. The skin was anesthetized with lidocaine. Free flow of clear CSF was obtained prior to injecting local anesthetic into the CSF. The spinal needle aspirated freely following injection. The needle was carefully withdrawn. The patient tolerated the procedure well.

## 2022-01-27 NOTE — Anesthesia Postprocedure Evaluation (Signed)
Anesthesia Post Note  Patient: Cindy Walter  Procedure(s) Performed: TOTAL KNEE ARTHROPLASTY (Right: Knee)     Patient location during evaluation: PACU Anesthesia Type: Spinal Level of consciousness: oriented and awake and alert Pain management: pain level controlled Vital Signs Assessment: post-procedure vital signs reviewed and stable Respiratory status: spontaneous breathing, respiratory function stable and patient connected to nasal cannula oxygen Cardiovascular status: blood pressure returned to baseline and stable Postop Assessment: no headache, no backache and no apparent nausea or vomiting Anesthetic complications: no   No notable events documented.  Last Vitals:  Vitals:   01/27/22 1130 01/27/22 1151  BP: 138/80 139/71  Pulse: 60 63  Resp: (!) 21 16  Temp: (!) 36.4 C 36.4 C  SpO2: 98% 100%              Shelton Silvas

## 2022-01-27 NOTE — Interval H&P Note (Signed)
History and Physical Interval Note:  01/27/2022 8:19 AM  Cindy Walter  has presented today for surgery, with the diagnosis of Right knee degenerative joint disease.  The various methods of treatment have been discussed with the patient and family. After consideration of risks, benefits and other options for treatment, the patient has consented to  Procedure(s): TOTAL KNEE ARTHROPLASTY (Right) as a surgical intervention.  The patient's history has been reviewed, patient examined, no change in status, stable for surgery.  I have reviewed the patient's chart and labs.  Questions were answered to the patient's satisfaction.     Javier Docker

## 2022-01-27 NOTE — Anesthesia Procedure Notes (Signed)
Anesthesia Regional Block: Adductor canal block   Pre-Anesthetic Checklist: , timeout performed,  Correct Patient, Correct Site, Correct Laterality,  Correct Procedure, Correct Position, site marked,  Risks and benefits discussed,  Surgical consent,  Pre-op evaluation,  At surgeon's request and post-op pain management  Laterality: Right  Prep: chloraprep       Needles:  Injection technique: Single-shot  Needle Type: Echogenic Stimulator Needle     Needle Length: 9cm  Needle Gauge: 21     Additional Needles:   Procedures:,,,, ultrasound used (permanent image in chart),,    Narrative:  Start time: 01/27/2022 8:21 AM End time: 01/27/2022 8:26 AM Injection made incrementally with aspirations every 5 mL.  Performed by: Personally  Anesthesiologist: Shelton Silvas, MD  Additional Notes: Patient tolerated the procedure well. Local anesthetic introduced in an incremental fashion under minimal resistance after negative aspirations. No paresthesias were elicited. After completion of the procedure, no acute issues were identified and patient continued to be monitored by RN.

## 2022-01-27 NOTE — Transfer of Care (Signed)
Immediate Anesthesia Transfer of Care Note  Patient: Cindy Walter  Procedure(s) Performed: TOTAL KNEE ARTHROPLASTY (Right: Knee)  Patient Location: PACU  Anesthesia Type:Spinal and MAC combined with regional for post-op pain  Level of Consciousness: awake, alert  and patient cooperative  Airway & Oxygen Therapy: Patient Spontanous Breathing and Patient connected to face mask oxygen  Post-op Assessment: Report given to RN and Post -op Vital signs reviewed and stable  Post vital signs: Reviewed and stable  Last Vitals:  Vitals Value Taken Time  BP 120/56 01/27/22 1100  Temp    Pulse 63 01/27/22 1101  Resp 16 01/27/22 1101  SpO2 100 % 01/27/22 1101  Vitals shown include unvalidated device data.  Last Pain:  Vitals:   01/27/22 0706  TempSrc:   PainSc: 0-No pain      Patients Stated Pain Goal: 3 (41/66/06 3016)  Complications: No notable events documented.

## 2022-01-27 NOTE — Brief Op Note (Signed)
01/27/2022  10:46 AM  PATIENT:  Sharyne Richters  66 y.o. female  PRE-OPERATIVE DIAGNOSIS:  Right knee degenerative joint disease  POST-OPERATIVE DIAGNOSIS:  Right knee degenerative joint disease  PROCEDURE:  Procedure(s): TOTAL KNEE ARTHROPLASTY (Right)  SURGEON:  Surgeon(s) and Role:    Jene Every, MD - Primary  PHYSICIAN ASSISTANT:   ASSISTANTS: Bissell   ANESTHESIA:   spinal  EBL:  50 mL   BLOOD ADMINISTERED:none  DRAINS: none  2 LOCAL MEDICATIONS USED:  MARCAINE     SPECIMEN:  No Specimen  DISPOSITION OF SPECIMEN:  N/A  COUNTS:  YES  TOURNIQUET:   Total Tourniquet Time Documented: Thigh (Right) - 58 minutes Total: Thigh (Right) - 58 minutes   DICTATION: .Other Dictation: Dictation Number [id number]   PLAN OF CARE: Admit for overnight observation  PATIENT DISPOSITION:  PACU - hemodynamically stable.   Delay start of Pharmacological VTE agent (>24hrs) due to surgical blood loss or risk of bleeding: no

## 2022-01-27 NOTE — Anesthesia Procedure Notes (Signed)
Procedure Name: MAC Date/Time: 01/27/2022 8:33 AM  Performed by: West Pugh, CRNAPre-anesthesia Checklist: Patient identified, Emergency Drugs available, Suction available, Patient being monitored and Timeout performed Patient Re-evaluated:Patient Re-evaluated prior to induction Oxygen Delivery Method: Simple face mask Preoxygenation: Pre-oxygenation with 100% oxygen Placement Confirmation: positive ETCO2 Dental Injury: Teeth and Oropharynx as per pre-operative assessment

## 2022-01-27 NOTE — Op Note (Signed)
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  PROCEDURE PERFORMED:  Right total knee arthroplasty utilizing Attune DePuy rotating platform 5 femur, 5 tibia, 6 mm insert, 35 patella.  ANESTHESIA:  Spinal.  ASSISTANT:  Andrez Grime, PA.  HISTORY:  A 66 year old with end-stage osteoarthrosis, varus deformity of the right knee, refractory to conservative treatment.  Negative impact on her activities of daily living.  Indicated for replacement of the degenerated joint.  Risks and benefits  discussed including bleeding, infection, damage to neurovascular structures, no change in symptoms, worsening symptoms, DVT, PE, arthrofibrosis, anesthetic complications, etc.  TECHNIQUE:  With the patient in supine position after induction of adequate spinal anesthesia, 2 grams Kefzol, the right lower extremity was prepped and draped and exsanguinated in usual sterile fashion.  Thigh tourniquet inflated to 25 mmHg.  Midline  incision was then made over the knee.  Full thickness flaps were developed.  Median parapatellar arthrotomy was performed.  Soft tissue elevated medially, preserving the MCL.  Patella was gently everted, knee was flexed.  Osteoarthrosis was noted in  patellofemoral, medial and lateral compartments bone-on-bone.  Fat pad was debrided.  Remnants of medial and lateral menisci and the ACL were excised.  A Leksell rongeur was utilized to fashion the notch above the femoral notch as a starting point for  the femoral drill.  We drilled in line with the femur entering the canal.  It was irrigated, used a T-handle and then followed by an intramedullary  guide with 9 off the distal femur, 5-degree right.  Pinned and we performed our distal femoral cut.  We  then sized off the anterior cortex, first the 6, then better at 5.  We placed a 5.  This was in 3 degrees of external rotation.  We then performed the anterior cut.  We felt that was a bit off the anterior cortex.  We dropped the 5 block 2 mm, repinned  it and then performed anterior, posterior and chamfer cuts.  Soft tissues protected at all times posteriorly with Crego's.  Following this, the jig was then removed.  The tibia was subluxed.  External alignment guide was utilized with 2 off the defect,  which was posterior medial. Parallel to the shaft, 3 degrees slope bisecting tibiotalar joint.  This was then pinned, and we performed our proximal tibial cut, protecting the popliteus posteriorly.  Following this, we utilized an extension gap trial in  full extension and 6 was satisfactory.  Patella everted, knee flexed, gently subluxed.  I used a 5 tray, maximizing the coverage of the tibial plateau just to the medial aspect of the tibial tubercle.  This was then pinned, harvested bone from the  central tibial canal and impacted into the distal femur.  I then drilled centrally, used our punch guide.  We removed the pins. I turned attention back to the femur.  I used our box cutting guide.  Bisecting the canal, this was then pinned.  I performed  a box cut.  I then placed a trial femur and impacted into place.  I drilled our lug holes.  A 6 mm insert was placed and brought it into full extension, had full extension, full flexion, good stability  with varus and valgus stressing at 0-30 degrees,  negative anterior drawer.  Patella was then everted, measured to a 25, planed to a 15 with an external patellar guide.  Measured to a 35, medialized a trial paddle parallel to the joint line and drilled our peg holes.  The trial patella was then placed,  I reduced the patella and had excellent patellofemoral  tracking.  All trials were then removed.  We checked posteriorly, any remnants of menisci were excised.  I cauterized the geniculates.  The capsule was intact.  Pulsatile lavage was used within the  joint at low lavage.  Following this, it was irrigated with Prontosan irrigation.  The knee was then flexed, patella was everted, tibia subluxed.  We checked that posteriorly and removed all redundant cement meticulously.  I then copiously irrigated the  joint with Prontosan and placed a 6 mm insert, reduced the knee.  We had full extension, full flexion, good stability to varus valgus stressing at 0-30 degrees, negative anterior drawer.  Following this, in slight flexion, we reapproximated the patellar  arthrotomy with #1 Vicryl in interrupted figure-of-eight sutures and it was oversewn with a running Stratafix.  At that point, I had excellent patellofemoral tracking.  Irrigated the subcutaneous tissue with irrigation of Prontosan, subcutaneous with 2-0  and skin with subcuticular Monocryl.  Prior to that, had flexion to gravity at 90 degrees and negative anterior drawer.  Sterile dressing applied.  She was then awoken and transported to the recovery room in satisfactory condition.  The patient tolerated the procedure well.  No complications.  Assistant, Andrez Grime, PA was used throughout the case for patient positioning, retraction, and closure.  Tourniquet time was 58 minutes.  EBL was 50 mL.   NIK D: 01/27/2022 10:53:40 am T: 01/27/2022 12:01:00 pm  JOB: [id number]/ [id number]

## 2022-01-27 NOTE — Discharge Instructions (Addendum)

## 2022-01-27 NOTE — Evaluation (Signed)
Physical Therapy Evaluation Patient Details Name: Cindy Walter MRN: [id number] DOB: 05/11/1956 Today's Date: 01/27/2022  History of Present Illness  Patient is a 66 y/o female admited due to R knee DJD now s/p TKA.  PMH positive for hepatitis, ceaserian x 3, wisdom tooth extraction and breast cyst excision.  Clinical Impression  Patient presents with mobility limited due to pain, decreased ROM and strength R LE.  She was able to get up and walk the hallway with RW and minguard to min A.  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  She will benefit from skilled PT in the acute setting.  Will defer post acute therapy recommendations to MD.        Recommendations for follow up therapy are one component of a multi-disciplinary discharge planning process, led by the attending physician.  Recommendations may be updated based on patient status, additional functional criteria and insurance authorization.  Follow Up Recommendations Follow physician's recommendations for discharge plan and follow up therapies      Assistance Recommended at Discharge Intermittent Supervision/Assistance  Patient can return home with the following  Help with stairs or ramp for entrance;Assist for transportation;Assistance with cooking/housework    Equipment Recommendations Rolling walker (2 wheels)  Recommendations for Other Services       Functional Status Assessment Patient has had a recent decline in their functional status and demonstrates the ability to make significant improvements in function in a reasonable and predictable amount of time.     Precautions / Restrictions Precautions Precautions: Fall;Knee Required Braces or Orthoses: Knee Immobilizer - Right Restrictions Weight Bearing Restrictions: Yes RLE Weight Bearing: Weight bearing as tolerated      Mobility  Bed Mobility Overal bed mobility: Needs Assistance Bed Mobility: Supine to Sit      Supine to sit: HOB elevated, Min guard     General bed mobility comments: for initiating R LE movment    Transfers Overall transfer level: Needs assistance Equipment used: Rolling walker (2 wheels) Transfers: Sit to/from Stand Sit to Stand: Min assist           General transfer comment: cues for hand placement, technique, assist for balance    Ambulation/Gait Ambulation/Gait assistance: Min guard Gait Distance (Feet): 100 Feet Assistive device: Rolling walker (2 wheels) Gait Pattern/deviations: Step-through pattern, Decreased stride length, Antalgic, Decreased stance time - right       General Gait Details: mild antalgia, cues for WBAT and for sequencing  Stairs            Wheelchair Mobility    Modified Rankin (Stroke Patients Only)       Balance Overall balance assessment: Needs assistance   Sitting balance-Leahy Scale: Good       Standing balance-Leahy Scale: Fair Standing balance comment: static balance unsupported, but UE assist for dynamic balance                             Pertinent Vitals/Pain Pain Assessment Pain Assessment: 0-10 Pain Score: 5  Pain Location: R knee Pain Descriptors / Indicators: Aching, Tightness Pain Intervention(s): Monitored during session    Home Living Family/patient expects to be discharged to:: Private residence Living Arrangements: Spouse/significant other Available Help at Discharge: Friend(s) Type of Home: Other(Comment) (condo) Home Access: Level entry       Home Layout: One level Home Equipment: Grab bars - tub/shower;Rolling Walker (2 wheels)  Prior Function Prior Level of Function : Independent/Modified Independent             Mobility Comments: 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; full time       Higher education careers adviser   Dominant Hand: Left    Extremity/Trunk Assessment   Upper Extremity Assessment Upper Extremity Assessment: Overall WFL for tasks assessed    Lower  Extremity Assessment Lower Extremity Assessment: RLE deficits/detail RLE Deficits / Details: AROM knee flexion about 45 in supine, positive quad set, hip flexion 3-/5 with mild quad lag, ankle WFL       Communication   Communication: No difficulties  Cognition Arousal/Alertness: Awake/alert Behavior During Therapy: WFL for tasks assessed/performed Overall Cognitive Status: Within Functional Limits for tasks assessed                                          General Comments General comments (skin integrity, edema, etc.): significant other present and supportive    Exercises Total Joint Exercises Ankle Circles/Pumps: AROM, Both, 10 reps, Supine Quad Sets: AROM, Right, 5 reps, Supine, Both Heel Slides: AROM, Right, 5 reps, Supine   Assessment/Plan    PT Assessment Patient needs continued PT services  PT Problem List Decreased strength;Decreased mobility;Decreased balance;Decreased knowledge of use of DME;Pain;Decreased activity tolerance;Decreased range of motion;Decreased safety awareness       PT Treatment Interventions DME instruction;Therapeutic activities;Gait training;Therapeutic exercise;Patient/family education;Balance training;Functional mobility training    PT Goals (Current goals can be found in the Care Plan section)  Acute Rehab PT Goals Patient Stated Goal: to return home PT Goal Formulation: With patient/family Time For Goal Achievement: 02/03/22 Potential to Achieve Goals: Good    Frequency 7X/week     Co-evaluation               AM-PAC PT "6 Clicks" Mobility  Outcome Measure Help needed turning from your back to your side while in a flat bed without using bedrails?: A Little Help needed moving from lying on your back to sitting on the side of a flat bed without using bedrails?: A Little Help needed moving to and from a bed to a chair (including a wheelchair)?: A Little Help needed standing up from a chair using your arms (e.g.,  wheelchair or bedside chair)?: A Little Help needed to walk in hospital room?: A Little Help needed climbing 3-5 steps with a railing? : Total 6 Click Score: 16    End of Session Equipment Utilized During Treatment: Gait belt;Right knee immobilizer Activity Tolerance: Patient tolerated treatment well Patient left: in chair;with call bell/phone within reach;with family/visitor present   PT Visit Diagnosis: Other abnormalities of gait and mobility (R26.89);Difficulty in walking, not elsewhere classified (R26.2);Pain Pain - Right/Left: Right Pain - part of body: Knee    Time: 4496-7591 PT Time Calculation (min) (ACUTE ONLY): 27 min   Charges:   PT Evaluation $PT Eval Low Complexity: 1 Low PT Treatments $Therapeutic Exercise: 8-22 mins        Sheran Lawless, PT Acute Rehabilitation Services Office:[phone] 01/27/2022   Elray Mcgregor 01/27/2022, 3:05 PM

## 2022-01-28 ENCOUNTER — Encounter (HOSPITAL_COMMUNITY): Payer: Self-pay | Admitting: Specialist

## 2022-01-28 DIAGNOSIS — M1711 Unilateral primary osteoarthritis, right knee: Secondary | ICD-10-CM | POA: Diagnosis not present

## 2022-01-28 LAB — CBC
HCT: 35.6 % — ABNORMAL LOW (ref 36.0–46.0)
Hemoglobin: 11.9 g/dL — ABNORMAL LOW (ref 12.0–15.0)
MCH: 31.8 pg (ref 26.0–34.0)
MCHC: 33.4 g/dL (ref 30.0–36.0)
MCV: 95.2 fL (ref 80.0–100.0)
Platelets: 244 10*3/uL (ref 150–400)
RBC: 3.74 MIL/uL — ABNORMAL LOW (ref 3.87–5.11)
RDW: 12.9 % (ref 11.5–15.5)
WBC: 10.6 10*3/uL — ABNORMAL HIGH (ref 4.0–10.5)
nRBC: 0 % (ref 0.0–0.2)

## 2022-01-28 LAB — BASIC METABOLIC PANEL
Anion gap: 7 (ref 5–15)
BUN: 12 mg/dL (ref 8–23)
CO2: 25 mmol/L (ref 22–32)
Calcium: 8.7 mg/dL — ABNORMAL LOW (ref 8.9–10.3)
Chloride: 104 mmol/L (ref 98–111)
Creatinine, Ser: 0.57 mg/dL (ref 0.44–1.00)
GFR, Estimated: >60 mL/min (ref >60)
Glucose, Bld: 128 mg/dL — ABNORMAL HIGH (ref 70–99)
Potassium: 4 mmol/L (ref 3.5–5.1)
Sodium: 136 mmol/L (ref 135–145)

## 2022-01-28 MED ORDER — KETOROLAC TROMETHAMINE 10 MG PO TABS: 10.0000 mg | ORAL_TABLET | Freq: Four times a day (QID) | ORAL | 0 refills | Status: AC | PRN

## 2022-01-28 MED ORDER — KETOROLAC TROMETHAMINE 15 MG/ML IJ SOLN
15.0000 mg | Freq: Four times a day (QID) | INTRAMUSCULAR | Status: DC
  Administered 2022-01-28: 15 mg via INTRAVENOUS
  Filled 2022-01-28: qty 1

## 2022-01-28 NOTE — Progress Notes (Signed)
Subjective: 1 Day Post-Op Procedure(s) (LRB): TOTAL KNEE ARTHROPLASTY (Right) Patient reports pain as 4 on 0-10 scale.   Denies CP or SOB.  Voiding without difficulty. Positive flatus. Objective: Vital signs in last 24 hours: Temp:  [97.3 F (36.3 C)-98.1 F (36.7 C)] 98.1 F (36.7 C) (06/22 0545) Pulse Rate:  [58-78] 58 (06/22 0545) Resp:  [13-21] 17 (06/22 0545) BP: (110-139)/(46-80) 119/72 (06/22 0545) SpO2:  [89 %-100 %] 89 % (06/22 0545)  Intake/Output from previous day: 06/21 0701 - 06/22 0700 In: 1700 [I.V.:1400; IV Piggyback:300] Out: 2000 [Urine:1950; Blood:50] Intake/Output this shift: No intake/output data recorded.  Recent Labs    01/28/22 0335  HGB 11.9*   Recent Labs    01/28/22 0335  WBC 10.6*  RBC 3.74*  HCT 35.6*  PLT 244   Recent Labs    01/28/22 0335  NA 136  K 4.0  CL 104  CO2 25  BUN 12  CREATININE 0.57  GLUCOSE 128*  CALCIUM 8.7*   No results for input(s): "LABPT", "INR" in the last 72 hours.  Neurologically intact Neurovascular intact Sensation intact distally Intact pulses distally Dorsiflexion/Plantar flexion intact Incision: dressing C/D/I  Assessment/Plan:  1 Day Post-Op Procedure(s) (LRB): TOTAL KNEE ARTHROPLASTY (Right) Advance diet Up with therapy Discharge home with home health Toradol prior to d/c then d/c home with PO toradol   Principal Problem:   Right knee DJD    Patient's anticipated LOS is less than 2 midnights, meeting these requirements: - Younger than 52 - Lives within 1 hour of care - Has a competent adult at home to recover with post-op recover - NO history of  - Chronic pain requiring opiods  - Diabetes  - Coronary Artery Disease  - Heart failure  - Heart attack  - Stroke  - DVT/VTE  - Cardiac arrhythmia  - Respiratory Failure/COPD  - Renal failure  - Anemia  - Advanced Liver disease      Javier Docker 01/28/2022, @[facility] 

## 2022-01-28 NOTE — TOC Transition Note (Signed)
Transition of Care [facility]) - CM/SW Discharge Note  Patient Details  Name: ASUCENA GALER MRN: [id number] Date of Birth: 22-Jun-1956  [facility] CM/SW Contact:  Sherie Don, LCSW Phone Number: 01/28/2022, 9:33 AM  Clinical Narrative: Patient is expected to discharge home after working with PT. CSW met with patient to review discharge plan and needs. Patient will go home with HHPT, which was prearranged with George. Patient will need a rolling walker, which was delivered to the patient's room by [employer]. Patient wanted a 3N1, but declined as it would private pay. TOC signing off.  Final next level of care: [place] Barriers to Discharge: No Barriers Identified  Patient Goals and CMS Choice Patient states their goals for this hospitalization and ongoing recovery are:: Discharge home with [place] CMS Medicare.gov Compare Post Acute Care list provided to:: Patient Choice offered to / list presented to : Patient  Discharge Plan and Services         DME Arranged: Walker rolling DME Agency: [employer] Representative spoke with at DME Agency: Wells Guiles  Readmission Risk Interventions     No data to display

## 2022-01-28 NOTE — Discharge Summary (Signed)
Physician Discharge Summary   Patient ID: Cindy Walter MRN: [id number] DOB/AGE: 10-15-1955 66 y.o.  Admit date: 01/27/2022 Discharge date: 01/28/22  Primary Diagnosis: right knee primary osteoarthritis  Admission Diagnoses:  Past Medical History:  Diagnosis Date   Hepatitis    treated 2017   Discharge Diagnoses:   Principal Problem:   Right knee DJD  Estimated body mass index is 29.76 kg/m as calculated from the following:   Height as of this encounter: 5\' 3"  (1.6 m).   Weight as of this encounter: 76.2 kg.  Procedure:  Procedure(s) (LRB): TOTAL KNEE ARTHROPLASTY (Right)   Consults: None  HPI: see H&P Laboratory Data: Admission on 01/27/2022  Component Date Value Ref Range Status   WBC 01/28/2022 10.6 (H)  4.0 - 10.5 K/uL Final   RBC 01/28/2022 3.74 (L)  3.87 - 5.11 MIL/uL Final   Hemoglobin 01/28/2022 11.9 (L)  12.0 - 15.0 g/dL Final   HCT 01/30/2022 35.6 (L)  36.0 - 46.0 % Final   MCV 01/28/2022 95.2  80.0 - 100.0 fL Final   MCH 01/28/2022 31.8  26.0 - 34.0 pg Final   MCHC 01/28/2022 33.4  30.0 - 36.0 g/dL Final   RDW 01/30/2022 12.9  11.5 - 15.5 % Final   Platelets 01/28/2022 244  150 - 400 K/uL Final   nRBC 01/28/2022 0.0  0.0 - 0.2 % Final   Performed at [facility], [address] [place], [place]   Sodium 01/28/2022 136  135 - 145 mmol/L Final   Potassium 01/28/2022 4.0  3.5 - 5.1 mmol/L Final   Chloride 01/28/2022 104  98 - 111 mmol/L Final   CO2 01/28/2022 25  22 - 32 mmol/L Final   Glucose, Bld 01/28/2022 128 (H)  70 - 99 mg/dL Final   Glucose reference range applies only to samples taken after fasting for at least 8 hours.   BUN 01/28/2022 12  8 - 23 mg/dL Final   Creatinine, Ser 01/28/2022 0.57  0.44 - 1.00 mg/dL Final   Calcium 01/30/2022 8.7 (L)  8.9 - 10.3 mg/dL Final   GFR, Estimated 01/28/2022 >60  >60 mL/min Final   Comment: (NOTE) Calculated using the CKD-EPI Creatinine Equation (2021)    Anion gap 01/28/2022  7  5 - 15 Final   Performed at [facility], [address], [place], [place]  Hospital Outpatient Visit on 01/22/2022  Component Date Value Ref Range Status   Sodium 01/22/2022 140  135 - 145 mmol/L Final   Potassium 01/22/2022 4.3  3.5 - 5.1 mmol/L Final   Chloride 01/22/2022 107  98 - 111 mmol/L Final   CO2 01/22/2022 27  22 - 32 mmol/L Final   Glucose, Bld 01/22/2022 104 (H)  70 - 99 mg/dL Final   Glucose reference range applies only to samples taken after fasting for at least 8 hours.   BUN 01/22/2022 13  8 - 23 mg/dL Final   Creatinine, Ser 01/22/2022 0.56  0.44 - 1.00 mg/dL Final   Calcium 01/24/2022 9.5  8.9 - 10.3 mg/dL Final   GFR, Estimated 01/22/2022 >60  >60 mL/min Final   Comment: (NOTE) Calculated using the CKD-EPI Creatinine Equation (2021)    Anion gap 01/22/2022 6  5 - 15 Final   Performed at [facility], [address], [place], [place]   WBC 01/22/2022 5.3  4.0 - 10.5 K/uL Final   RBC 01/22/2022 4.34  3.87 - 5.11 MIL/uL  Final   Hemoglobin 01/22/2022 14.0  12.0 - 15.0 g/dL Final   HCT 52/84/1324 40.6  36.0 - 46.0 % Final   MCV 01/22/2022 93.5  80.0 - 100.0 fL Final   MCH 01/22/2022 32.3  26.0 - 34.0 pg Final   MCHC 01/22/2022 34.5  30.0 - 36.0 g/dL Final   RDW 40/05/2724 12.9  11.5 - 15.5 % Final   Platelets 01/22/2022 277  150 - 400 K/uL Final   nRBC 01/22/2022 0.0  0.0 - 0.2 % Final   Performed at [facility], [address], [place], [place] [address]   MRSA, PCR 01/22/2022 NEGATIVE  NEGATIVE Final   Staphylococcus aureus 01/22/2022 NEGATIVE  NEGATIVE Final   Comment: (NOTE) The Xpert SA Assay (FDA approved for NASAL specimens in patients 8 years of age and older), is one component of a comprehensive surveillance program. It is not intended to diagnose infection nor to guide or monitor treatment. Performed at [facility], [address], [place], [place]  [address]      X-Rays:DG Knee 1-2 Views Right  Result Date: 01/27/2022 CLINICAL DATA:  Postop total knee replacement EXAM: RIGHT KNEE - 1-2 VIEW COMPARISON:  None Available. FINDINGS: Right knee replacement in satisfactory position and alignment. No fracture or complication. Gas and fluid in the joint. IMPRESSION: Satisfactory right knee replacement Electronically Signed   By: Marlan Palau M.D.   On: 01/27/2022 11:43    EKG:No orders found for this or any previous visit.   Hospital Course: [name] is a 66 y.o. who was admitted to [facility]. They were brought to the operating room on 01/27/2022 and underwent Procedure(s): TOTAL KNEE ARTHROPLASTY.  Patient tolerated the procedure well and was later transferred to the recovery room and then to the orthopaedic floor for postoperative care.  They were given PO and IV analgesics for pain control following their surgery.  They were given 24 hours of postoperative antibiotics of  Anti-infectives (From admission, onward)    Start     Dose/Rate Route Frequency Ordered Stop   01/27/22 1430  ceFAZolin (ANCEF) IVPB 2g/100 mL premix        2 g 200 mL/hr over 30 Minutes Intravenous Every 6 hours 01/27/22 1158 01/27/22 2114   01/27/22 0700  ceFAZolin (ANCEF) IVPB 2g/100 mL premix        2 g 200 mL/hr over 30 Minutes Intravenous On call to O.R. 01/27/22 2595 01/27/22 0909      and started on DVT prophylaxis in the form of Aspirin, TED hose, and SCDs .   PT and OT were ordered for total joint protocol.  Discharge planning consulted to help with postop disposition and equipment needs.  Patient had a fair night on the evening of surgery.  They started to get up OOB with therapy on day one. By day one, the patient had progressed with therapy and meeting their goals.  Incision was healing well.  Patient was seen in rounds and was ready to go home.   Diet: Regular diet Activity:WBAT Follow-up:in 10-14 days Disposition - Home with HHPT Discharged  Condition: good   Discharge Instructions     Call MD / Call 911   Complete by: As directed    If you experience chest pain or shortness of breath, CALL 911 and be transported to the hospital emergency room.  If you develope a fever above 101 F, pus (white drainage) or increased drainage or redness at the wound, or  calf pain, call your surgeon's office.   Constipation Prevention   Complete by: As directed    Drink plenty of fluids.  Prune juice may be helpful.  You may use a stool softener, such as Colace (over the counter) 100 mg twice a day.  Use MiraLax (over the counter) for constipation as needed.   Diet - low sodium heart healthy   Complete by: As directed    Increase activity slowly as tolerated   Complete by: As directed    Post-operative opioid taper instructions:   Complete by: As directed    POST-OPERATIVE OPIOID TAPER INSTRUCTIONS: It is important to wean off of your opioid medication as soon as possible. If you do not need pain medication after your surgery it is ok to stop day one. Opioids include: Codeine, Hydrocodone(Norco, Vicodin), Oxycodone(Percocet, oxycontin) and hydromorphone amongst others.  Long term and even short term use of opiods can cause: Increased pain response Dependence Constipation Depression Respiratory depression And more.  Withdrawal symptoms can include Flu like symptoms Nausea, vomiting And more Techniques to manage these symptoms Hydrate well Eat regular healthy meals Stay active Use relaxation techniques(deep breathing, meditating, yoga) Do Not substitute Alcohol to help with tapering If you have been on opioids for less than two weeks and do not have pain than it is ok to stop all together.  Plan to wean off of opioids This plan should start within one week post op of your joint replacement. Maintain the same interval or time between taking each dose and first decrease the dose.  Cut the total daily intake of opioids by one tablet  each day Next start to increase the time between doses. The last dose that should be eliminated is the evening dose.         Allergies as of 01/28/2022   No Known Allergies      Medication List     STOP taking these medications    ibuprofen 200 MG tablet Commonly known as: ADVIL       TAKE these medications    alendronate 70 MG tablet Commonly known as: FOSAMAX Take 70 mg by mouth every Monday.   aspirin EC 81 MG tablet Take 1 tablet (81 mg total) by mouth 2 (two) times daily after a meal. Day after surgery   cetirizine 10 MG tablet Commonly known as: ZYRTEC Take 10 mg by mouth daily as needed for allergies.   cholecalciferol 25 MCG (1000 UNIT) tablet Commonly known as: VITAMIN D3 Take 1,000 Units by mouth daily.   docusate sodium 100 MG capsule Commonly known as: Colace Take 1 capsule (100 mg total) by mouth 2 (two) times daily as needed for mild constipation.   ketorolac 10 MG tablet Commonly known as: TORADOL Take 1 tablet (10 mg total) by mouth every 6 (six) hours as needed.   methocarbamol 500 MG tablet Commonly known as: ROBAXIN Take 1 tablet (500 mg total) by mouth every 8 (eight) hours as needed for up to 28 days for muscle spasms.   oxyCODONE 5 MG immediate release tablet Commonly known as: Oxy IR/ROXICODONE Take 1 tablet (5 mg total) by mouth every 4 (four) hours as needed for severe pain.   PATADAY OP Place 1 drop into both eyes daily as needed (allergies).   polyethylene glycol 17 g packet Commonly known as: MIRALAX / GLYCOLAX Take 17 g by mouth daily.   VITAMIN E PO Take 1 capsule by mouth daily.        Follow-up  Information     Jene Every, MD Follow up in 2 week(s).   Specialty: Orthopedic Surgery Contact information: [address] [place] [address] [phone]         [facility] Follow up.   Specialty: Home Health Services Why: You will receive PT at home from [facility]. Contact  information: [address] [place] [address] [phone]                 Signed: Andrez Grime PA-C Orthopaedic Surgery 01/28/2022, 1:40 PM

## 2022-01-28 NOTE — Progress Notes (Signed)
Physical Therapy Treatment Patient Details Name: Cindy Walter MRN: [id number] DOB: 08/22/1955 Today's Date: 01/28/2022   History of Present Illness Patient is a 66 y/o female admited due to R knee DJD now s/p TKA.  PMH positive for hepatitis, ceaserian x 3, wisdom tooth extraction and breast cyst excision.    PT Comments    POD # 1 am session "early bird" AxO x 3 very pleasant Lady plans to D/C to home later today.  Assisted OOB.  General bed mobility comments: demonstarted and instructed how to belt to self assist LE.  Required increased rest break after.  Assisted with amb in hallway.  General transfer comment: 25% VC's on proper hand placement and safety with turns.  Practiced one step she has to enter her home.  General stair comments: 50% VC's on proper walker placement as well as sequencing.  "up with the good, down with the bad"  also instructed to "lock" her "bad" knee when performing for increased stability. Then "boyfriend" arrived so reviewed for him. Then returned to room to perform some TE's following HEP handout.  Instructed on proper tech, freq as well as use of ICE.  Discussed safe proper tech to step in/out tub/shower using "back kick" tech and proper sequencing "in with the good and out with the bad".  Discussed car transfers.  Educated on use of ICE using Cryotherapy Cooler as well as Gel  Packs.  Addressed all mobility questions, discussed appropriate activity.   Pt ready for D/C to home. Pt has yet to void and awaiting MD D/C    Recommendations for follow up therapy are one component of a multi-disciplinary discharge planning process, led by the attending physician.  Recommendations may be updated based on patient status, additional functional criteria and insurance authorization.  Follow Up Recommendations  Follow physician's recommendations for discharge plan and follow up therapies     Assistance Recommended at Discharge Intermittent Supervision/Assistance  Patient can  return home with the following Help with stairs or ramp for entrance;Assist for transportation;Assistance with cooking/housework   Equipment Recommendations  Rolling walker (2 wheels)    Recommendations for Other Services       Precautions / Restrictions Precautions Precautions: Fall;Knee Precaution Comments: instructed no pillow under knee Restrictions Weight Bearing Restrictions: No RLE Weight Bearing: Weight bearing as tolerated     Mobility  Bed Mobility Overal bed mobility: Needs Assistance Bed Mobility: Supine to Sit     Supine to sit: HOB elevated, Min guard     General bed mobility comments: demonstarted and instructed how to belt to self assist LE    Transfers Overall transfer level: Needs assistance Equipment used: Rolling walker (2 wheels) Transfers: Sit to/from Stand Sit to Stand: Min guard, Supervision           General transfer comment: 25% VC's on proper hand placement and safety with turns    Ambulation/Gait Ambulation/Gait assistance: Supervision, Min guard Gait Distance (Feet): 45 Feet Assistive device: Rolling walker (2 wheels) Gait Pattern/deviations: Step-through pattern, Decreased stride length, Antalgic, Decreased stance time - right Gait velocity: decreased     General Gait Details: tolerated a functional distance in hallway with 25% VC's on proper sequencing as well as safety with turns.   Stairs Stairs: Yes Stairs assistance: Supervision, Min guard Stair Management: No rails, Step to pattern, Forwards, With walker Number of Stairs: 1 General stair comments: 50% VC's on proper walker placement as well as sequencing.  "up with the good, down with the bad"  also instructed to "lock" her "bad" knee when performing for increased stability.   Wheelchair Mobility    Modified Rankin (Stroke Patients Only)       Balance                                            Cognition Arousal/Alertness:  Awake/alert Behavior During Therapy: WFL for tasks assessed/performed Overall Cognitive Status: Within Functional Limits for tasks assessed                                 General Comments: AxO x 3 slightly groggy was given Dilaudid early        Exercises  Total Knee Replacement TE's following HEP handout 10 reps B LE ankle pumps 05 reps towel squeezes 05 reps knee presses 05 reps heel slides  05 reps SAQ's 05 reps SLR's 05 reps ABD Educated on use of gait belt to assist with TE's Followed by ICE     General Comments        Pertinent Vitals/Pain Pain Assessment Pain Assessment: 0-10 Pain Score: 7  Pain Location: R knee Pain Descriptors / Indicators: Aching, Tightness, Tender, Operative site guarding Pain Intervention(s): Monitored during session, Premedicated before session, Repositioned, Patient requesting pain meds-RN notified, Ice applied    Home Living                          Prior Function            PT Goals (current goals can now be found in the care plan section) Progress towards PT goals: Progressing toward goals    Frequency    7X/week      PT Plan Current plan remains appropriate    Co-evaluation              AM-PAC PT "6 Clicks" Mobility   Outcome Measure  Help needed turning from your back to your side while in a flat bed without using bedrails?: A Little Help needed moving from lying on your back to sitting on the side of a flat bed without using bedrails?: A Little Help needed moving to and from a bed to a chair (including a wheelchair)?: A Little Help needed standing up from a chair using your arms (e.g., wheelchair or bedside chair)?: A Little Help needed to walk in hospital room?: A Little Help needed climbing 3-5 steps with a railing? : A Little 6 Click Score: 18    End of Session Equipment Utilized During Treatment: Gait belt Activity Tolerance: Patient tolerated treatment well Patient left: in  chair;with call bell/phone within reach;with family/visitor present Nurse Communication: Mobility status PT Visit Diagnosis: Other abnormalities of gait and mobility (R26.89);Difficulty in walking, not elsewhere classified (R26.2);Pain Pain - Right/Left: Right Pain - part of body: Knee     Time: 0942-1030 PT Time Calculation (min) (ACUTE ONLY): 48 min  Charges:  $Gait Training: 8-22 mins $Therapeutic Exercise: 8-22 mins $Therapeutic Activity: 8-22 mins                    Felecia Shelling  PTA Acute  Rehabilitation Services Office M-F          [phone] Weekend pager [phone]

## 2023-03-13 IMAGING — DX DG KNEE 1-2V*R*
2 series · 2 of 2 positions shown · non-contrast
Comparison: None Available.

CLINICAL DATA: Postop total knee replacement

EXAM:
RIGHT KNEE - 1-2 VIEW

[knee ap]
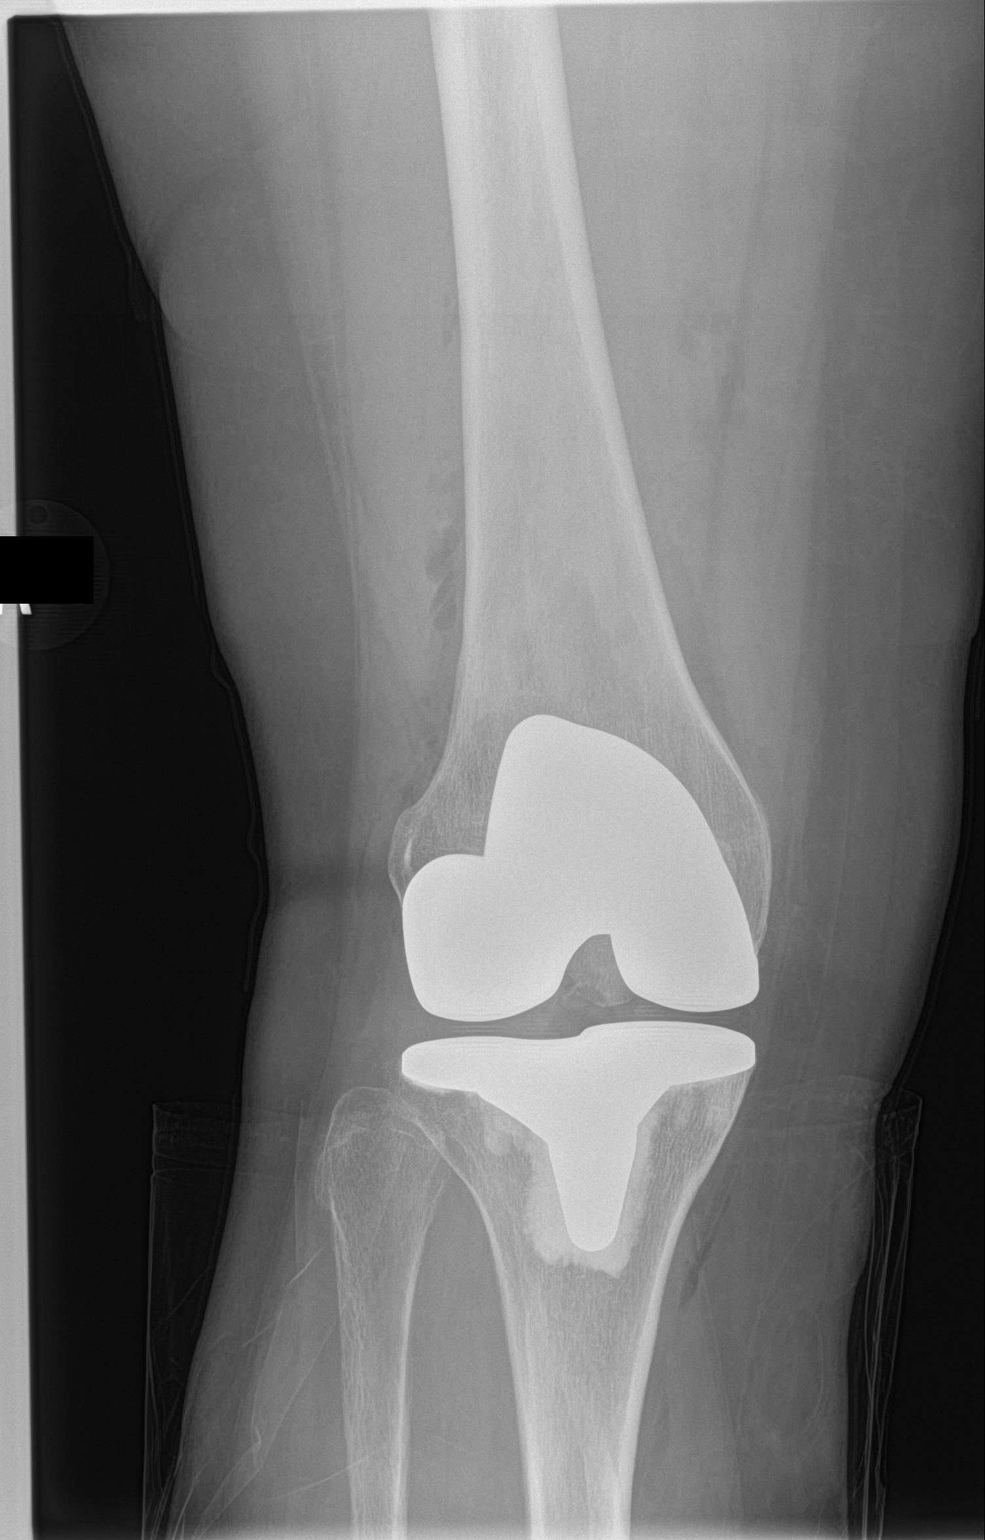

[knee lat]
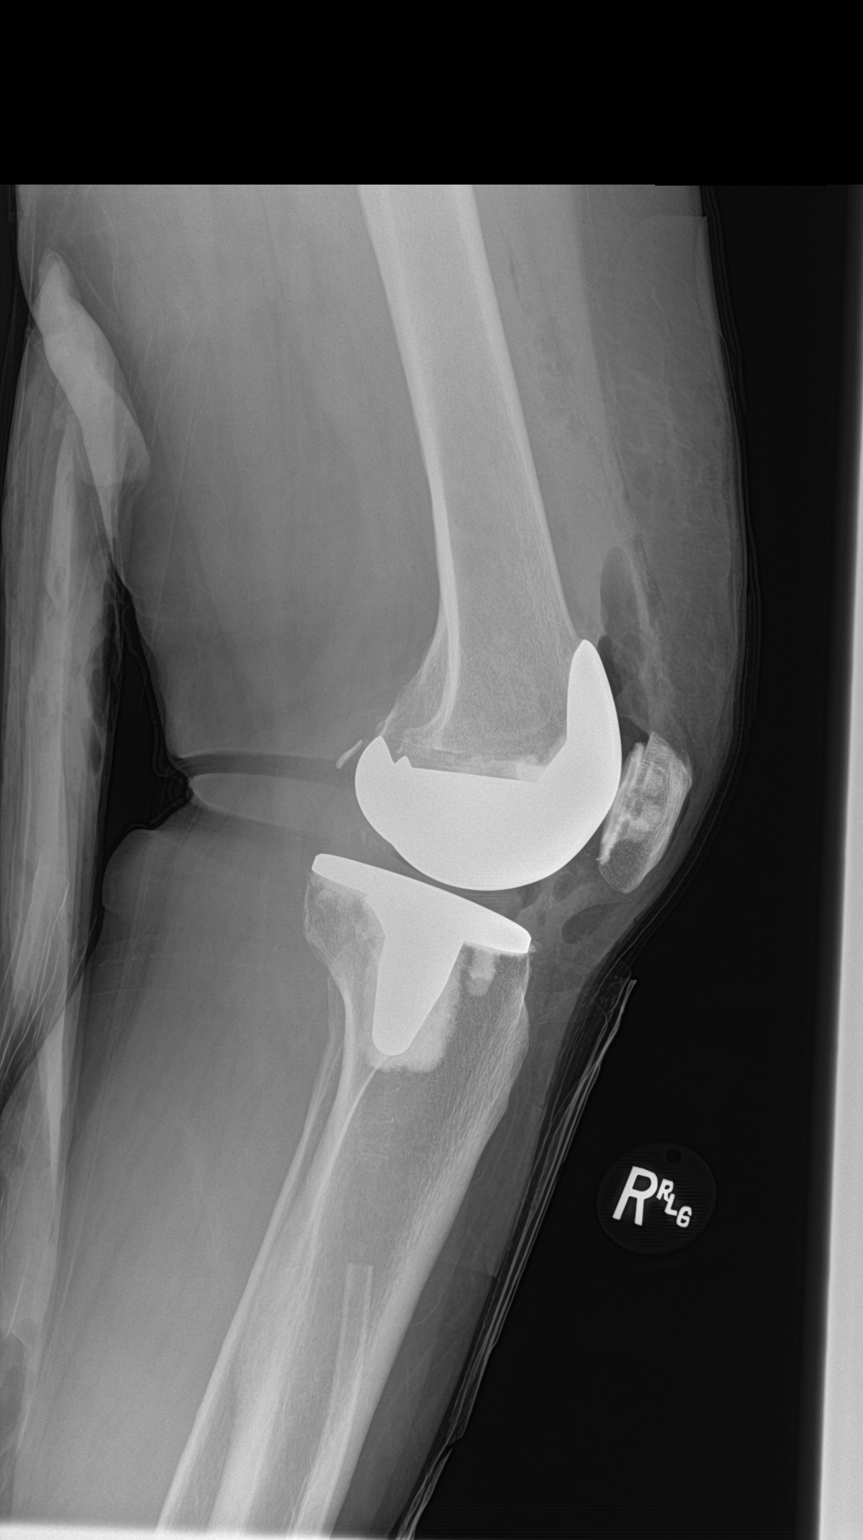

[2 of 2 positions shown; findings below may reference images not displayed]

FINDINGS: Right knee replacement in satisfactory position and alignment. No
fracture or complication. Gas and fluid in the joint.
IMPRESSION: Satisfactory right knee replacement

## 2023-05-10 NOTE — Progress Notes (Signed)
 Fracture Prevention Program- New Patient Evaluation  Initial Visit: Cindy Walter is here for a bone health evaluation for secondary fracture prevention, osteoporosis, and/or risk for future fractures. I have summarized  medical history, medications, and fracture history today.  Referring provider:  No care team member to display   Was patient referred to the Fracture Prevention Program [facility]) for current fracture? No  Was patient evaluated by the Fracture Prevention Program (FPP) within 6 weeks post fracture? No- other reason: No fracture  HPI: Cindy Walter is a pleasant, 67 y.o. female who presents as referral from Dr. Darryle Bihari for evaluation and treatment of osteoporosis.  Ledia has been on alendronate for about 2 years and wants to know if there is any other medication that can be taken at less interval.  She denies any history of fractures.  She takes a magnesium supplement that has some calcium  in it daily.  However, she does not know the dose.  She takes vitamin D3 supplements daily.  She developed menopause at age 79.  I reviewed her latest DEXA scan  Due to concerns for compromised bone quality and strength and risk of future fractures patient was appropriately identified and referred for further investigation and treatment recommendations for improved bone quality and strength and reduction of future fractures.  No past medical history on file.  Social History: Single Other Significant Medical History: Knee arthritis Significant Dental History: Has 1 implant on top right molar about 4 years ago.  She sees dentist twice a year.  Prior Bone Health Treatment: Alendronate  Bone Health History: 1.  Have you had height loss or gotten shorter since your 20's? Yes; best estimate of height loss - 1 inches  2.  Are you postmenopausal? Yes, age 40, naturally  3.  Have you ever had a low testosterone level in the past? N/A  4.  Did you ever take hormone replacements? No  5.  Current  fracture? No  6. Any other broken bones since you turned 52 or older? No  7. Are you a Vegetarian or Vegan? No  8. Currently smoke/use smokeless tobacco? No. Former smoker? Yes, quit date: 2000  9. Alcohol use? Yes: monthly, drinks per session: Less than 11 glass maybe twice a year  10. Have you had more than 2 falls in the past year? No  11.  How active have you been for the last 12 months? Very active  12.  How many caffeinated beverages do you have per day? less than 3  13. Have you ever been diagnosed with any of the following:                   RA: No                  Lupus: No                  Celiac Disease or Absorption Disorder: No                  Gastric Bypass: No                  COPD: No                  GERD (Heartburn or Acid Reflux): No                  Parathyroid Gland Issues: No  Thyroid Gland Issues: No                  Diabetes: No                  Chronic Kidney Disease: No                  Kidney Stones: No                  Seizure Disorder: No                  HIV/AIDS: No                  Hepatitis B: No                  Hepatitis C: Yes, treated with hepatitis C antiviral                  Paget's Disease: No                  Depression: No                  Dementia: No                  Cancer: No  14.  Did either of your parents have a hip fracture after the age of 5 or diagnosis of osteoporosis? No  15. Any other family history of osteoporosis? Yes - mother  12. Are you taking the nutritional supplements Calcium  or Vitamin D ? Yes - calcium /magnesium daily, vitamin D3 daily  17. Are you on any high-risk medications? None  18. History of steroid use (greater than 3 months total)? No  19. Low body weight (<127 lbs) No  20. Are you currently or have you ever taken any medication for bone loss?  Yes  21. Have you had a bone density test? Yes  22. Have you had any cardiovascular events, such as stroke or heart attack, in the past year? No       DXA Results:    EXAM: DUAL X-RAY ABSORPTIOMETRY (DXA) FOR BONE MINERAL DENSITY 06/04/2022 12:04 pm   CLINICAL DATA:  67 year old Female Postmenopausal. Screening for osteoporosis.   Patient is or has been on bone building therapies.   TECHNIQUE: An axial (e.g., hips, spine) and/or appendicular (e.g., radius) exam was performed, as appropriate, using Pharmacist, community at [employer]. Images are obtained for bone mineral density measurement and are not obtained for diagnostic purposes. [id number]   Trabecular Bone Score (TBS) was derived from the texture of the DXA image of the spine. TBS has been shown to be related to bone microarchitecture and fracture risk. It provides information independent of bone mineral density and can be used to modify FRAX score.   Exclusions: None.   COMPARISON:  None. New baseline.   FINDINGS: Scan quality: Good.   LUMBAR SPINE (L1-L4):   BMD (in g/cm2): 0.781   T-score: -2.4   Z-score: -0.7   LEFT FEMORAL NECK:   BMD (in g/cm2): 0.683   T-score: -1.5   Z-score: 0.0   LEFT TOTAL HIP:   BMD (in g/cm2): 0.844   T-score: -0.8   Z-score: 0.4   FRAX 10-YEAR PROBABILITY OF FRACTURE:   Patient does not meet criteria for FRAX assessment.   TRABECULAR BONE SCORE (TBS): L1-L4= 1.311   The lumbar spine TBS: Normal microarchitecture.   IMPRESSION: Osteopenia  based on BMD.   Fracture risk is unknown due to history of bone building therapy.   RECOMMENDATIONS: 1. All patients should optimize calcium  and vitamin D  intake.   2. Consider FDA-approved medical therapies in postmenopausal women and men aged 40 years and older, based on the following:   - A hip or vertebral (clinical or morphometric) fracture   - T-score less than or equal to -2.5 and secondary causes have been excluded.   - Low bone mass (T-score between -1.0 and -2.5) and a 10-year probability of a hip fracture greater than or  equal to 3% or a 10-year probability of a major osteoporosis-related fracture greater than or equal to 20% based on the [place] -adapted WHO algorithm.   - Clinician judgment and/or patient preferences may indicate treatment for people with 10-year fracture probabilities above or below these levels   3. Patients with diagnosis of osteoporosis or at high risk for fracture should have regular bone mineral density tests. For patients eligible for Medicare, routine testing is allowed once every 2 years. The testing frequency can be increased to one year for patients who have rapidly progressing disease, those who are receiving or discontinuing medical therapy to restore bone mass, or have additional risk factors.   Electronically Signed   By: Rosaline Collet M.D.   On: 06/04/2022 12:12   I have personally reviewed the procedure note and/or have reviewed and interpreted this image/images. Electronically Signed By: Rosaline Elvie Collet, MD on 06/04/2022 12:12 PM   Review of Systems: CONSTITUTIONAL: Appetite good, no fevers, night sweats or weight loss EYES: No vision changes, vision pain, vision redness, blurriness or double vision ENT: No ringing in ears, nasal discharge, nasal stuffiness, sore throat or hoarseness CV: No chest pain, shortness of breath or leg swelling RESPIRATORY: No cough, wheezing or dyspnea GI: No nausea, vomiting, abdominal pain or change in bowel habits GU: No dysuria, urgency or incontinence MS: No muscle pain, no joint pain, no joint stiffness, no swelling of joints, no redness of joints SKIN: No rashes or lesions NEURO: No mental status changes, no motor weakness, no sensory changes PSYCH: No nervousness, stress, depression or memory loss ENDOCRINE: No weight gain, hair loss or change in urinary frequency HEME/LYMPH: No easy bleeding, no easy bruising   Estimated body mass index is 31.21 kg/m as calculated from the following:   Height as of 04/05/23: 1.6 m (5' 3).    Weight as of 04/05/23: 79.9 kg (176 lb 3.2 oz).    OBJECTIVE: General Appearance: Well developed, well nourished, alert and cooperative, and appears to be in no acute distress. Appears stated age. Affect appropriate. Head: Normocephalic/atraumatic. Eyes: EOMI bilaterally. Vision is grossly intact. Ears: External auditory canals clear bilaterally. Hearing grossly intact bilaterally. Nose: Nares patent bilaterally, no nasal discharge. Throat: Moist mucous membranes. Teeth and gingiva in good general condition. Neck: Neck supple, non-tender without lymphadenopathy, masses or thyromegaly. CV: There is no peripheral edema, cyanosis or pallor. Extremities are warm and well perfused. Respiratory: Respirations nonlabored. Normal, symmetric chest wall expansion bilaterally. Abdomen: Soft, nondistended, nontender. No guarding or rebound.  No masses. Extremities: No significant deformity or joint abnormality. No edema. Peripheral pulses intact. Neurological: Strength and sensation symmetric and intact throughout. Reflexes 2+ throughout. Skin: Skin normal color, texture and turgor with no lesions or eruptions. Psychiatric: The mental examination revealed the patient was oriented to person, place, and time. The patient was able to demonstrate good judgment and reason, without hallucinations, abnormal affect or abnormal behaviors during  the examination. Patient is not suicidal.   ASSESSMENT: The patient was seen today for fracture program prevention.  Diagnoses and all orders for this visit:  1. Age-related osteoporosis without current pathological fracture  Comprehensive Metabolic Panel   Parathyroid Hormone (PTH), Intact   Phosphorus   Vitamin D , 25-Hydroxy    2. Counseling on health promotion and disease prevention  Comprehensive Metabolic Panel   Parathyroid Hormone (PTH), Intact   Phosphorus   Vitamin D , 25-Hydroxy       Did patient receive osteoporosis education and osteoporosis treatment  education? Yes  PLAN:  I spent 30 minutes face to face with patient today discussing the disease management of osteoporosis for the reduction of future fractures. I have explained the bone strength is equal to bone quality and density so a DXA scan is important to compliment care and for treatment guidance. Over half of the encounter time (50%) was spent counseling patient on the disease of osteoporosis, evidenced based best practice treatment options available and recommendations for improved bone quality, importance of nutritional supplements of Calcium  1000-1200mg  daily in divided doses (or from diet sources) and Vitamin D  2000-5000 units daily for improved bone quality, falls prevention and personal safety for the reduction of future fractures. We also discussed weight bearing exercise 3-4 times per week with the addition of resistance exercises. Clinicians Guidelines to treatment and management of osteoporosis using the most up to date recommendations from the Bone Health Osteoporosis Foundation utilized as they are evidence based. Patient was given handouts regarding bone health, osteoporosis, and all available treatment options including pharmacological interventions. Bone Density test recommended. Basic Bone Health labs will be ordered.  I had a detailed discussion with Ardelia regarding her recent DEXA scan and her history.  She has had improvement of BMD although scan tabs are similar.  She wanted can be taken that is less cumbersome at longer intervals.  She would like to try zoledronate.  Will see if her insurance will cover zoledronate.  Baseline osteoporosis lab work was ordered and any lab abnormality would be appropriate manage.  She was encouraged to optimize her calcium  and vitamin D3 intake as above.  I will continue to monitor her history.  She will follow-up in 1 year if there are no complications with zoledronate.  She is encouraged to call for any questions or concerns in the  interim.  All patients with osteoporosis as defined by a fragility fracture, low bone mass (osteopenia with High FRAX) or osteoporosis on BMD treatment is recommended to reduce the risk for future fractures. For Prevention of osteoporosis prior to a fracture, (Note: The National Osteoporosis Foundation recommends pharmacological treatment of all postmenopausal women with T-scores below -2.0, and those with T-scores below -1.5 with risk factors for osteoporosis. Osteoporosis is present when the T-score is below -2.5. The T-score is defined as the number of standard deviations above or below the average BMD value for young, healthy white women.) The BMD is not the only way to define osteoporosis so treatment should be based on patient history, risk and other factors as appropriate.

## 2024-03-30 NOTE — Telephone Encounter (Signed)
 Letter mailed

## 2024-04-25 ENCOUNTER — Emergency Department (HOSPITAL_COMMUNITY)

## 2024-04-25 ENCOUNTER — Emergency Department (HOSPITAL_COMMUNITY)
Admission: EM | Admit: 2024-04-25 | Discharge: 2024-05-09 | Disposition: E | Attending: Emergency Medicine | Admitting: Emergency Medicine

## 2024-04-25 DIAGNOSIS — R61 Generalized hyperhidrosis: Secondary | ICD-10-CM | POA: Insufficient documentation

## 2024-04-25 DIAGNOSIS — Y92410 Unspecified street and highway as the place of occurrence of the external cause: Secondary | ICD-10-CM | POA: Diagnosis not present

## 2024-04-25 DIAGNOSIS — R0603 Acute respiratory distress: Secondary | ICD-10-CM | POA: Insufficient documentation

## 2024-04-25 DIAGNOSIS — R4182 Altered mental status, unspecified: Secondary | ICD-10-CM | POA: Diagnosis not present

## 2024-04-25 DIAGNOSIS — S0990XA Unspecified injury of head, initial encounter: Secondary | ICD-10-CM | POA: Diagnosis present

## 2024-04-25 DIAGNOSIS — M7989 Other specified soft tissue disorders: Secondary | ICD-10-CM | POA: Diagnosis not present

## 2024-04-25 DIAGNOSIS — R000 Tachycardia, unspecified: Secondary | ICD-10-CM | POA: Insufficient documentation

## 2024-04-25 DIAGNOSIS — R451 Restlessness and agitation: Secondary | ICD-10-CM | POA: Insufficient documentation

## 2024-04-25 DIAGNOSIS — S270XXA Traumatic pneumothorax, initial encounter: Secondary | ICD-10-CM

## 2024-04-25 MED ORDER — ETOMIDATE 2 MG/ML IV SOLN
INTRAVENOUS | Status: AC | PRN
  Administered 2024-04-25: 20 mg via INTRAVENOUS

## 2024-04-25 MED ORDER — SODIUM BICARBONATE 8.4 % IV SOLN
INTRAVENOUS | Status: AC | PRN
Start: 2024-04-25 — End: 2024-04-25
  Administered 2024-04-25: 50 meq via INTRAVENOUS

## 2024-04-25 MED ORDER — DEXTROSE 50 % IV SOLN
INTRAVENOUS | Status: AC | PRN
  Administered 2024-04-25: 1 via INTRAVENOUS

## 2024-04-25 MED ORDER — EPINEPHRINE 1 MG/10ML IV SOSY
PREFILLED_SYRINGE | INTRAVENOUS | Status: AC | PRN
Start: 2024-04-25 — End: 2024-04-25
  Administered 2024-04-25 (×7): 1 mg via INTRAVENOUS

## 2024-04-25 MED ORDER — SODIUM CHLORIDE 0.9% IV SOLUTION
Freq: Once | INTRAVENOUS | Status: DC
Start: 2024-04-25 — End: 2024-04-25

## 2024-04-25 MED ORDER — CALCIUM CHLORIDE 10 % IV SOLN
INTRAVENOUS | Status: AC | PRN
Start: 2024-04-25 — End: 2024-04-25
  Administered 2024-04-25: 1 g via INTRAVENOUS

## 2024-04-25 MED ORDER — SODIUM CHLORIDE 0.9% IV SOLUTION: Freq: Once | INTRAVENOUS | Status: DC

## 2024-04-25 MED ORDER — ROCURONIUM BROMIDE 10 MG/ML (PF) SYRINGE
PREFILLED_SYRINGE | INTRAVENOUS | Status: AC | PRN
Start: 2024-04-25 — End: 2024-04-25
  Administered 2024-04-25: 100 mg via INTRAVENOUS

## 2024-04-26 LAB — BPAM RBC
Blood Product Expiration Date: 202509242359
Blood Product Expiration Date: 202509242359
ISSUE DATE / TIME: 202509170732
ISSUE DATE / TIME: 202509170732
Unit Type and Rh: 5100
Unit Type and Rh: 5100

## 2024-04-26 LAB — TYPE AND SCREEN
Unit division: 0
Unit division: 0
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
## 2024-05-09 NOTE — Procedures (Signed)
 Chest tube insertion  Date/Time: 2024-04-30 12:27 PM  Performed by: Ann Fine, MD Authorized by: Ann Fine, MD   Consent:    Consent obtained:  Emergent situation Pre-procedure details:    Skin preparation:  ChloraPrep Sedation:    Sedation type:  Deep Procedure details:    Placement location:  L lateral   Scalpel size:  15   Tube size (Fr):  20   Technique: blunt     Dissection instrument:  Finger and Kelly clamp   Ultrasound guidance: no     Tension pneumothorax: no     Tube connected to:  Suction   Suture material:  2-0 silk   Dressing:  4x4 sterile gauze Post-procedure details:    Patient tolerance of procedure:  Tolerated well, no immediate complications
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
## 2024-05-09 NOTE — Progress Notes (Signed)
 Follow up  referral from night time  Chaplain to continue support.  Son and daughter at bedside. Chaplain provided continued emotional and spiritual support to family. Family did not know funeral home. I gave son patient placement card with instructions on how to proceed. Chaplain available as needed.   Rayleen Dade, [place], [facility], Pager [phone]

## 2024-05-09 NOTE — ED Notes (Signed)
 ET tube in place, 7.5, 22 at the lip, CO2 detector used
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
## 2024-05-09 NOTE — ED Notes (Addendum)
 Internal paddles used to defib, patient in fine vfib

## 2024-05-09 NOTE — Procedures (Signed)
 Chest tube insertion  Date/Time: 2024/05/18 12:33 PM  Performed by: Ann Fine, MD Authorized by: Ann Fine, MD   Consent:    Consent obtained:  Emergent situation Pre-procedure details:    Skin preparation:  ChloraPrep Sedation:    Sedation type:  Deep Procedure details:    Scalpel size:  11   Tube size (Fr):  24   Technique: blunt     Dissection instrument:  Finger and Kelly clamp   Ultrasound guidance: no     Tension pneumothorax: no     Tube connected to:  Suction   Drainage characteristics:  Bloody   Suture material:  2-0 silk   Dressing:  4x4 sterile gauze Post-procedure details:    Patient tolerance of procedure:  Tolerated well, no immediate complications
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
## 2024-05-09 NOTE — ED Notes (Signed)
 70 CC output from L chest tube (sahara to suction) 40 CC output from R chest tube (sahara to suction)
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
## 2024-05-09 DEATH — deceased
